# Patient Record
Sex: Female | Born: 1990 | Race: Black or African American | Hispanic: No | Marital: Single | Smoking: Former smoker
Health system: Southern US, Community
[De-identification: ages and names within clinical notes are randomized; demographics above are authoritative.]

## PROBLEM LIST (undated history)

## (undated) DIAGNOSIS — M1990 Unspecified osteoarthritis, unspecified site: Secondary | ICD-10-CM

## (undated) DIAGNOSIS — T7840XA Allergy, unspecified, initial encounter: Secondary | ICD-10-CM

## (undated) DIAGNOSIS — I10 Essential (primary) hypertension: Secondary | ICD-10-CM

## (undated) DIAGNOSIS — D649 Anemia, unspecified: Secondary | ICD-10-CM

## (undated) HISTORY — DX: Allergy, unspecified, initial encounter: T78.40XA

## (undated) HISTORY — PX: TONSILLECTOMY: SUR1361

## (undated) HISTORY — DX: Unspecified osteoarthritis, unspecified site: M19.90

## (undated) HISTORY — DX: Essential (primary) hypertension: I10

---

## 2010-05-11 ENCOUNTER — Emergency Department: Payer: Self-pay | Admitting: Emergency Medicine

## 2010-11-09 ENCOUNTER — Observation Stay: Payer: Self-pay | Admitting: Obstetrics and Gynecology

## 2010-11-10 ENCOUNTER — Inpatient Hospital Stay: Payer: Self-pay

## 2011-04-06 ENCOUNTER — Emergency Department: Payer: Self-pay | Admitting: Internal Medicine

## 2011-04-06 LAB — URINALYSIS, COMPLETE
Bilirubin,UR: NEGATIVE
Glucose,UR: NEGATIVE mg/dL (ref 0–75)
Ketone: NEGATIVE
Nitrite: NEGATIVE
Ph: 5 (ref 4.5–8.0)
Protein: 30
RBC,UR: 356 /HPF (ref 0–5)
Specific Gravity: 1.029 (ref 1.003–1.030)
Squamous Epithelial: 5
WBC UR: 34 /HPF (ref 0–5)

## 2011-04-06 LAB — CBC
HCT: 40.9 % (ref 35.0–47.0)
HGB: 13.5 g/dL (ref 12.0–16.0)
MCH: 25.6 pg — ABNORMAL LOW (ref 26.0–34.0)
MCHC: 33 g/dL (ref 32.0–36.0)
MCV: 78 fL — ABNORMAL LOW (ref 80–100)
Platelet: 247 10*3/uL (ref 150–440)
RBC: 5.27 10*6/uL — ABNORMAL HIGH (ref 3.80–5.20)
RDW: 15.7 % — ABNORMAL HIGH (ref 11.5–14.5)
WBC: 5 10*3/uL (ref 3.6–11.0)

## 2011-04-06 LAB — WET PREP, GENITAL

## 2011-04-06 LAB — PREGNANCY, URINE: Pregnancy Test, Urine: NEGATIVE m[IU]/mL

## 2012-06-08 ENCOUNTER — Emergency Department: Payer: Self-pay | Admitting: Emergency Medicine

## 2012-06-08 LAB — URINALYSIS, COMPLETE
Bilirubin,UR: NEGATIVE
Glucose,UR: NEGATIVE mg/dL (ref 0–75)
Ketone: NEGATIVE
Nitrite: NEGATIVE
Ph: 5 (ref 4.5–8.0)
Protein: 30
RBC,UR: 18 /HPF (ref 0–5)
Specific Gravity: 1.028 (ref 1.003–1.030)
Squamous Epithelial: 13
WBC UR: 50 /HPF (ref 0–5)

## 2012-08-21 ENCOUNTER — Emergency Department: Payer: Self-pay | Admitting: Internal Medicine

## 2012-08-21 LAB — URINALYSIS, COMPLETE
Bilirubin,UR: NEGATIVE
Blood: NEGATIVE
Glucose,UR: NEGATIVE mg/dL (ref 0–75)
Ketone: NEGATIVE
Nitrite: POSITIVE
Ph: 7 (ref 4.5–8.0)
Protein: NEGATIVE
RBC,UR: 4 /HPF (ref 0–5)
Specific Gravity: 1.026 (ref 1.003–1.030)
Squamous Epithelial: 7
WBC UR: 30 /HPF (ref 0–5)

## 2012-08-21 LAB — GC/CHLAMYDIA PROBE AMP

## 2012-08-21 LAB — WET PREP, GENITAL

## 2012-10-27 ENCOUNTER — Emergency Department: Payer: Self-pay | Admitting: Emergency Medicine

## 2013-03-17 ENCOUNTER — Emergency Department: Payer: Self-pay | Admitting: Emergency Medicine

## 2013-04-17 ENCOUNTER — Emergency Department: Payer: Self-pay | Admitting: Emergency Medicine

## 2013-10-12 ENCOUNTER — Emergency Department: Payer: Self-pay | Admitting: Emergency Medicine

## 2014-11-17 ENCOUNTER — Encounter: Payer: Self-pay | Admitting: Urgent Care

## 2014-11-17 DIAGNOSIS — K088 Other specified disorders of teeth and supporting structures: Secondary | ICD-10-CM | POA: Insufficient documentation

## 2014-11-17 DIAGNOSIS — Z720 Tobacco use: Secondary | ICD-10-CM | POA: Insufficient documentation

## 2014-11-17 NOTE — ED Notes (Signed)
Patient presents with c/o RIGHT upper jaw pain for "awhile". Reports that she cant eat, sleep, or talk well. No local dentist.

## 2014-11-18 ENCOUNTER — Emergency Department
Admission: EM | Admit: 2014-11-18 | Discharge: 2014-11-18 | Discharge status: Against Medical Advice | Payer: Self-pay | Attending: Emergency Medicine | Admitting: Emergency Medicine

## 2014-12-16 ENCOUNTER — Emergency Department
Admission: EM | Admit: 2014-12-16 | Discharge: 2014-12-16 | Disposition: A | Discharge status: Home / Self Care | Payer: Self-pay | Attending: Emergency Medicine | Admitting: Emergency Medicine

## 2014-12-16 ENCOUNTER — Encounter: Payer: Self-pay | Admitting: Emergency Medicine

## 2014-12-16 DIAGNOSIS — N390 Urinary tract infection, site not specified: Secondary | ICD-10-CM | POA: Insufficient documentation

## 2014-12-16 DIAGNOSIS — Z3202 Encounter for pregnancy test, result negative: Secondary | ICD-10-CM | POA: Insufficient documentation

## 2014-12-16 DIAGNOSIS — Z720 Tobacco use: Secondary | ICD-10-CM | POA: Insufficient documentation

## 2014-12-16 DIAGNOSIS — K5900 Constipation, unspecified: Secondary | ICD-10-CM | POA: Insufficient documentation

## 2014-12-16 DIAGNOSIS — N940 Mittelschmerz: Secondary | ICD-10-CM | POA: Insufficient documentation

## 2014-12-16 LAB — WET PREP, GENITAL
Clue Cells Wet Prep HPF POC: NONE SEEN
Trich, Wet Prep: NONE SEEN
WBC, Wet Prep HPF POC: NONE SEEN
Yeast Wet Prep HPF POC: NONE SEEN

## 2014-12-16 LAB — URINALYSIS COMPLETE WITH MICROSCOPIC (ARMC ONLY)
Bacteria, UA: NONE SEEN
Bilirubin Urine: NEGATIVE
Glucose, UA: NEGATIVE mg/dL
Ketones, ur: NEGATIVE mg/dL
Nitrite: NEGATIVE
Protein, ur: 30 mg/dL — AB
Specific Gravity, Urine: 1.029 (ref 1.005–1.030)
pH: 6 (ref 5.0–8.0)

## 2014-12-16 LAB — POCT PREGNANCY, URINE: Preg Test, Ur: NEGATIVE

## 2014-12-16 LAB — CHLAMYDIA/NGC RT PCR (ARMC ONLY)
Chlamydia Tr: NOT DETECTED
N gonorrhoeae: NOT DETECTED

## 2014-12-16 LAB — HCG, QUANTITATIVE, PREGNANCY: hCG, Beta Chain, Quant, S: <1 m[IU]/mL (ref <5)

## 2014-12-16 MED ORDER — CEPHALEXIN 500 MG PO CAPS
500.0000 mg | ORAL_CAPSULE | Freq: Once | ORAL | Status: AC
  Administered 2014-12-16: 500 mg via ORAL
  Filled 2014-12-16 (×2): qty 1

## 2014-12-16 MED ORDER — CEPHALEXIN 500 MG PO CAPS: 500.0000 mg | ORAL_CAPSULE | Freq: Two times a day (BID) | ORAL | Status: DC

## 2014-12-16 MED ORDER — IBUPROFEN 800 MG PO TABS
800.0000 mg | ORAL_TABLET | Freq: Once | ORAL | Status: AC
  Administered 2014-12-16: 800 mg via ORAL
  Filled 2014-12-16: qty 1

## 2014-12-16 NOTE — Discharge Instructions (Signed)
You have been seen in the Emergency Department (ED) for abdominal pain.  Your evaluation did not identify a clear cause of your symptoms but was generally reassuring. It may be caused by a mild urinary tract infection or pain in between your menstrual cycles (Mittelschmerz).  Please follow up as instructed above regarding todays emergent visit and the symptoms that are bothering you.  Return to the ED if your abdominal pain worsens or fails to improve, you develop bloody vomiting, bloody diarrhea, you are unable to tolerate fluids due to vomiting, fever greater than 101, or other symptoms that concern you.   Abdominal Pain, Women Abdominal (stomach, pelvic, or belly) pain can be caused by many things. It is important to tell your doctor:  The location of the pain.  Does it come and go or is it present all the time?  Are there things that start the pain (eating certain foods, exercise)?  Are there other symptoms associated with the pain (fever, nausea, vomiting, diarrhea)? All of this is helpful to know when trying to find the cause of the pain. CAUSES   Stomach: virus or bacteria infection, or ulcer.  Intestine: appendicitis (inflamed appendix), regional ileitis (Crohn's disease), ulcerative colitis (inflamed colon), irritable bowel syndrome, diverticulitis (inflamed diverticulum of the colon), or cancer of the stomach or intestine.  Gallbladder disease or stones in the gallbladder.  Kidney disease, kidney stones, or infection.  Pancreas infection or cancer.  Fibromyalgia (pain disorder).  Diseases of the female organs:  Uterus: fibroid (non-cancerous) tumors or infection.  Fallopian tubes: infection or tubal pregnancy.  Ovary: cysts or tumors.  Pelvic adhesions (scar tissue).  Endometriosis (uterus lining tissue growing in the pelvis and on the pelvic organs).  Pelvic congestion syndrome (female organs filling up with blood just before the menstrual period).  Pain with  the menstrual period.  Pain with ovulation (producing an egg).  Pain with an IUD (intrauterine device, birth control) in the uterus.  Cancer of the female organs.  Functional pain (pain not caused by a disease, may improve without treatment).  Psychological pain.  Depression. DIAGNOSIS  Your doctor will decide the seriousness of your pain by doing an examination.  Blood tests.  X-rays.  Ultrasound.  CT scan (computed tomography, special type of X-ray).  MRI (magnetic resonance imaging).  Cultures, for infection.  Barium enema (dye inserted in the large intestine, to better view it with X-rays).  Colonoscopy (looking in intestine with a lighted tube).  Laparoscopy (minor surgery, looking in abdomen with a lighted tube).  Major abdominal exploratory surgery (looking in abdomen with a large incision). TREATMENT  The treatment will depend on the cause of the pain.   Many cases can be observed and treated at home.  Over-the-counter medicines recommended by your caregiver.  Prescription medicine.  Antibiotics, for infection.  Birth control pills, for painful periods or for ovulation pain.  Hormone treatment, for endometriosis.  Nerve blocking injections.  Physical therapy.  Antidepressants.  Counseling with a psychologist or psychiatrist.  Minor or major surgery. HOME CARE INSTRUCTIONS   Do not take laxatives, unless directed by your caregiver.  Take over-the-counter pain medicine only if ordered by your caregiver. Do not take aspirin because it can cause an upset stomach or bleeding.  Try a clear liquid diet (broth or water) as ordered by your caregiver. Slowly move to a bland diet, as tolerated, if the pain is related to the stomach or intestine.  Have a thermometer and take your temperature several times  a day, and record it.  Bed rest and sleep, if it helps the pain.  Avoid sexual intercourse, if it causes pain.  Avoid stressful  situations.  Keep your follow-up appointments and tests, as your caregiver orders.  If the pain does not go away with medicine or surgery, you may try:  Acupuncture.  Relaxation exercises (yoga, meditation).  Group therapy.  Counseling. SEEK MEDICAL CARE IF:   You notice certain foods cause stomach pain.  Your home care treatment is not helping your pain.  You need stronger pain medicine.  You want your IUD removed.  You feel faint or lightheaded.  You develop nausea and vomiting.  You develop a rash.  You are having side effects or an allergy to your medicine. SEEK IMMEDIATE MEDICAL CARE IF:   Your pain does not go away or gets worse.  You have a fever.  Your pain is felt only in portions of the abdomen. The right side could possibly be appendicitis. The left lower portion of the abdomen could be colitis or diverticulitis.  You are passing blood in your stools (bright red or black tarry stools, with or without vomiting).  You have blood in your urine.  You develop chills, with or without a fever.  You pass out. MAKE SURE YOU:   Understand these instructions.  Will watch your condition.  Will get help right away if you are not doing well or get worse. Document Released: 01/15/2007 Document Revised: 08/04/2013 Document Reviewed: 02/04/2009 [facility] Patient Information 2015 [place], [place]. This information is not intended to replace advice given to you by your health care provider. Make sure you discuss any questions you have with your health care provider.  Urinary Tract Infection Urinary tract infections (UTIs) can develop anywhere along your urinary tract. Your urinary tract is your body's drainage system for removing wastes and extra water. Your urinary tract includes two kidneys, two ureters, a bladder, and a urethra. Your kidneys are a pair of bean-shaped organs. Each kidney is about the size of your fist. They are located below your ribs, one on each side  of your spine. CAUSES Infections are caused by microbes, which are microscopic organisms, including fungi, viruses, and bacteria. These organisms are so small that they can only be seen through a microscope. Bacteria are the microbes that most commonly cause UTIs. SYMPTOMS  Symptoms of UTIs may vary by age and gender of the patient and by the location of the infection. Symptoms in young women typically include a frequent and intense urge to urinate and a painful, burning feeling in the bladder or urethra during urination. Older women and men are more likely to be tired, shaky, and weak and have muscle aches and abdominal pain. A fever may mean the infection is in your kidneys. Other symptoms of a kidney infection include pain in your back or sides below the ribs, nausea, and vomiting. DIAGNOSIS To diagnose a UTI, your caregiver will ask you about your symptoms. Your caregiver also will ask to provide a urine sample. The urine sample will be tested for bacteria and white blood cells. White blood cells are made by your body to help fight infection. TREATMENT  Typically, UTIs can be treated with medication. Because most UTIs are caused by a bacterial infection, they usually can be treated with the use of antibiotics. The choice of antibiotic and length of treatment depend on your symptoms and the type of bacteria causing your infection. HOME CARE INSTRUCTIONS  If you were prescribed  antibiotics, take them exactly as your caregiver instructs you. Finish the medication even if you feel better after you have only taken some of the medication.  Drink enough water and fluids to keep your urine clear or pale yellow.  Avoid caffeine, tea, and carbonated beverages. They tend to irritate your bladder.  Empty your bladder often. Avoid holding urine for long periods of time.  Empty your bladder before and after sexual intercourse.  After a bowel movement, women should cleanse from front to back. Use each  tissue only once. SEEK MEDICAL CARE IF:   You have back pain.  You develop a fever.  Your symptoms do not begin to resolve within 3 days. SEEK IMMEDIATE MEDICAL CARE IF:   You have severe back pain or lower abdominal pain.  You develop chills.  You have nausea or vomiting.  You have continued burning or discomfort with urination. MAKE SURE YOU:   Understand these instructions.  Will watch your condition.  Will get help right away if you are not doing well or get worse. Document Released: 12/28/2004 Document Revised: 09/19/2011 Document Reviewed: 04/28/2011 [facility] Patient Information 2015 [place], [place]. This information is not intended to replace advice given to you by your health care provider. Make sure you discuss any questions you have with your health care provider.

## 2014-12-16 NOTE — ED Provider Notes (Signed)
[facility] Emergency Department Provider Note  ____________________________________________  Time seen: Approximately 5:56 PM  I have reviewed the triage vital signs and the nursing notes.   HISTORY  Chief Complaint Vaginal Bleeding; Headache; and Nausea    HPI Laurie Petersen is a 24 y.o. female G1 P1 who had her last menstrual period about 10 days ago who presents with pelvic cramping, nausea, general malaise, and some vaginal bleeding since yesterday.  She reports that she noticed the bleeding twice when she wiped after using the bathroom.  She has also had some mild constipation.  She denies fever/chills, chest pain, shortness of breath.  She describes the cramping is mild to severe, intermittent, and waxing and waning.  Nothing specifically makes it better or worse.  He is sexually active with her boyfriend and they do not use condoms.  She denies any recent vaginal discharge.she is not having any dysuria and does not believe the blood is coming from her urine.   History reviewed. No pertinent past medical history.  There are no active problems to display for this patient.   History reviewed. No pertinent past surgical history.  Current Outpatient Rx  Name  Route  Sig  Dispense  Refill  . cephALEXin (KEFLEX) 500 MG capsule   Oral   Take 1 capsule (500 mg total) by mouth 2 (two) times daily.   14 capsule   0     Allergies Review of patient's allergies indicates no known allergies.  History reviewed. No pertinent family history.  Social History Social History  Substance Use Topics  . Smoking status: Current Every Day Smoker  . Smokeless tobacco: None  . Alcohol Use: No    Review of Systems Constitutional: No fever/chills Eyes: No visual changes. ENT: No sore throat. Cardiovascular: Denies chest pain. Respiratory: Denies shortness of breath. Gastrointestinal: intermittent pelvic cramping.  Nausea with no vomiting.  Frequent  constipation. Genitourinary: Negative for dysuria. Musculoskeletal: Negative for back pain. Skin: Negative for rash. Neurological: Negative for headaches, focal weakness or numbness.  10-point ROS otherwise negative.  ____________________________________________   PHYSICAL EXAM:  VITAL SIGNS: ED Triage Vitals  Enc Vitals Group     BP 12/16/14 1519 129/86 mmHg     Pulse Rate 12/16/14 1519 81     Resp 12/16/14 1519 18     Temp 12/16/14 1519 97.3 F (36.3 C)     Temp Source 12/16/14 1519 Oral     SpO2 12/16/14 1519 100 %     Weight 12/16/14 1519 145 lb (65.772 kg)     Height 12/16/14 1519  (1.676 m)     Head Cir --      Peak Flow --      Pain Score 12/16/14 1520 10     Pain Loc --      Pain Edu? --      Excl. in GC? --     Constitutional: Alert and oriented. Well appearing and in no acute distress. Eyes: Conjunctivae are normal. PERRL. EOMI. Head: Atraumatic. Nose: No congestion/rhinnorhea. Mouth/Throat: Mucous membranes are moist.  Oropharynx non-erythematous. Neck: No stridor.   Cardiovascular: Normal rate, regular rhythm. Grossly normal heart sounds.  Good peripheral circulation. Respiratory: Normal respiratory effort.  No retractions. Lungs CTAB. Gastrointestinal: Soft. Mild generalized tenderness to palpation with no focal tenderness and no rebound or guarding but it seems to be most notable in the suprapubic region. No distention.  Genitourinary: Normal external exam.  Moderate to large amount of whitish discharge present  in the vaginal vault.  Cervix is well-appearing with no cervicitis or friable lesions.  No cervical motion tenderness on bimanual exam and no adnexal tenderness. Musculoskeletal: No lower extremity tenderness nor edema.  No joint effusions. Neurologic:  Normal speech and language. No gross focal neurologic deficits are appreciated.  Skin:  Skin is warm, dry and intact. No rash noted. Psychiatric: Mood and affect are normal. Speech and behavior  are normal.  ____________________________________________   LABS (all labs ordered are listed, but only abnormal results are displayed)  Labs Reviewed  URINALYSIS COMPLETEWITH MICROSCOPIC ([facility] ONLY) - Abnormal; Notable for the following:    Color, Urine YELLOW (*)    APPearance HAZY (*)    Hgb urine dipstick 3+ (*)    Protein, ur 30 (*)    Leukocytes, UA 2+ (*)    Squamous Epithelial / LPF 6-30 (*)    All other components within normal limits  CHLAMYDIA/NGC RT PCR ([facility] ONLY)  WET PREP, GENITAL  HCG, QUANTITATIVE, PREGNANCY  POC URINE PREG, ED  POCT PREGNANCY, URINE   ____________________________________________  EKG  Not indicated ____________________________________________  RADIOLOGY   No results found.  ____________________________________________   PROCEDURES  Procedure(s) performed: None  Critical Care performed: No ____________________________________________   INITIAL IMPRESSION / ASSESSMENT AND PLAN / ED COURSE  Pertinent labs & imaging results that were available during my care of the patient were reviewed by me and considered in my medical decision making (see chart for details).  The patient is well-appearing and in no acute distress with normal vital signs.  An extensive abdominal plain/pelvic pain workup has been unremarkable except for what appears to be a mild urinary tract infection.  I do not believe that her symptoms represent a kidney stone as she has no CVA tenderness nor flank pain and no history of stones, and her tenderness is most notable in the suprapubic region which is consistent with a UTI; if this is nephrolithiasis it is a very atypical presentation but at this time I do not believe she would benefit from a CT scan or additional imaging. Her pelvic exam was unremarkable.  I will treat her for a urinary tract infection and I gave her my usual and customary return precautions.  She understands and agrees with the  plan.  ____________________________________________  FINAL CLINICAL IMPRESSION(S) / ED DIAGNOSES  Final diagnoses:  UTI (urinary tract infection), uncomplicated  Mittelschmerz      NEW MEDICATIONS STARTED DURING THIS VISIT:  New Prescriptions   CEPHALEXIN (KEFLEX) 500 MG CAPSULE    Take 1 capsule (500 mg total) by mouth 2 (two) times daily.     Loleta Rose, MD 12/16/14 2037

## 2014-12-16 NOTE — ED Notes (Signed)
Patient to ED with report of pelvic pain that started yesterday along with bright red bleeding, reports that she had her period the 1-4 of September. Patient also reports feeling headaches, nausea and just generally not well over the last few weeks.

## 2015-02-11 ENCOUNTER — Encounter: Payer: Self-pay | Admitting: *Deleted

## 2015-02-11 ENCOUNTER — Emergency Department
Admission: EM | Admit: 2015-02-11 | Discharge: 2015-02-11 | Disposition: A | Discharge status: Home / Self Care | Payer: Self-pay | Attending: Student | Admitting: Student

## 2015-02-11 DIAGNOSIS — Z3202 Encounter for pregnancy test, result negative: Secondary | ICD-10-CM | POA: Insufficient documentation

## 2015-02-11 DIAGNOSIS — Z720 Tobacco use: Secondary | ICD-10-CM | POA: Insufficient documentation

## 2015-02-11 DIAGNOSIS — Z792 Long term (current) use of antibiotics: Secondary | ICD-10-CM | POA: Insufficient documentation

## 2015-02-11 DIAGNOSIS — L02212 Cutaneous abscess of back [any part, except buttock]: Secondary | ICD-10-CM | POA: Insufficient documentation

## 2015-02-11 LAB — POCT PREGNANCY, URINE: Preg Test, Ur: NEGATIVE

## 2015-02-11 MED ORDER — SULFAMETHOXAZOLE-TRIMETHOPRIM 800-160 MG PO TABS: 1.0000 | ORAL_TABLET | Freq: Two times a day (BID) | ORAL | Status: DC

## 2015-02-11 MED ORDER — HYDROCODONE-ACETAMINOPHEN 5-325 MG PO TABS
1.0000 | ORAL_TABLET | Freq: Once | ORAL | Status: AC
  Administered 2015-02-11: 1 via ORAL
  Filled 2015-02-11: qty 1

## 2015-02-11 MED ORDER — HYDROCODONE-ACETAMINOPHEN 5-325 MG PO TABS: 1.0000 | ORAL_TABLET | ORAL | Status: DC | PRN

## 2015-02-11 NOTE — ED Notes (Signed)
Pt has large raised area to lower back with a white area in the center

## 2015-02-11 NOTE — ED Provider Notes (Signed)
[facility] Emergency Department Provider Note  ____________________________________________  Time seen: Approximately 9:15 AM  I have reviewed the triage vital signs and the nursing notes.   HISTORY  Chief Complaint Abscess   HPI Laurie Petersen is a 24 y.o. female is here complaining of abscess to her lower back. She states she has a white center to it and she was hoping it would pop on its on. She states she has never had anything like this before in the past.She denies any fever or chills. There's been no nausea or vomiting. Patient has not taken any over-the-counter medication for her pain. She says she is unable to lie on her back due to pain in this area. Currently she rates her pain a 10 out of 10.   History reviewed. No pertinent past medical history.  There are no active problems to display for this patient.   History reviewed. No pertinent past surgical history.  Current Outpatient Rx  Name  Route  Sig  Dispense  Refill  . cephALEXin (KEFLEX) 500 MG capsule   Oral   Take 1 capsule (500 mg total) by mouth 2 (two) times daily.   14 capsule   0   . HYDROcodone-acetaminophen (NORCO/VICODIN) 5-325 MG tablet   Oral   Take 1 tablet by mouth every 4 (four) hours as needed for moderate pain.   20 tablet   0   . sulfamethoxazole-trimethoprim (BACTRIM DS,SEPTRA DS) 800-160 MG tablet   Oral   Take 1 tablet by mouth 2 (two) times daily.   20 tablet   0     Allergies Review of patient's allergies indicates no known allergies.  No family history on file.  Social History Social History  Substance Use Topics  . Smoking status: Current Every Day Smoker -- 0.50 packs/day    Types: Cigarettes  . Smokeless tobacco: None  . Alcohol Use: No    Review of Systems Constitutional: No fever/chills Cardiovascular: Denies chest pain. Respiratory: Denies shortness of breath. Gastrointestinal:   No nausea, no vomiting. Musculoskeletal: Negative for  back pain. Skin: Negative for rash. Neurological: Negative for headaches, focal weakness or numbness.  10-point ROS otherwise negative.  ____________________________________________   PHYSICAL EXAM:  VITAL SIGNS: ED Triage Vitals  Enc Vitals Group     BP 02/11/15 0902 128/74 mmHg     Pulse Rate 02/11/15 0902 92     Resp 02/11/15 0902 18     Temp 02/11/15 0902 98.5 F (36.9 C)     Temp Source 02/11/15 0902 Oral     SpO2 02/11/15 0902 98 %     Weight 02/11/15 0902 140 lb (63.504 kg)     Height 02/11/15 0902 5\' 6"  (1.676 m)     Head Cir --      Peak Flow --      Pain Score 02/11/15 0902 10     Pain Loc --      Pain Edu? --      Excl. in GC? --     Constitutional: Alert and oriented. Well appearing and in no acute distress. Eyes: Conjunctivae are normal. PERRL. EOMI. Head: Atraumatic. Nose: No congestion/rhinnorhea. Neck: No stridor.   Cardiovascular: Normal rate, regular rhythm. Grossly normal heart sounds.  Good peripheral circulation. Respiratory: Normal respiratory effort.  No retractions. Lungs CTAB. Gastrointestinal: Soft and nontender. No distention.  Musculoskeletal: No lower extremity tenderness nor edema.  No joint effusions. Neurologic:  Normal speech and language. No gross focal neurologic deficits are appreciated.  No gait instability. Skin:  Skin is warm, dry and intact. There is one single lesion on the lower back with erythema. Area is hard, nonfluctuant and extremely tender. Psychiatric: Mood and affect are normal. Speech and behavior are normal.  ____________________________________________   LABS (all labs ordered are listed, but only abnormal results are displayed)  Labs Reviewed  POC URINE PREG, ED  POCT PREGNANCY, URINE    PROCEDURES  Procedure(s) performed: None  Critical Care performed: No  ____________________________________________   INITIAL IMPRESSION / ASSESSMENT AND PLAN / ED COURSE  Pertinent labs & imaging results that were  available during my care of the patient were reviewed by me and considered in my medical decision making (see chart for details).  Pregnancy test was negative. Patient was given Norco as needed for pain and Bactrim DS for 10 days. Patient was instructed to use warm compresses to the area frequently. She is return to the emergency room if any continued problems and if the area does not open up on its on. She is aware that she may have to still have it drained. ____________________________________________   FINAL CLINICAL IMPRESSION(S) / ED DIAGNOSES  Final diagnoses:  Abscess of lower back      Tommi Rumps, PA-C 02/11/15 1221  Gayla Doss, MD 02/11/15 1626

## 2015-02-11 NOTE — Discharge Instructions (Signed)
Abscess An abscess (boil or furuncle) is an infected area on or under the skin. This area is filled with yellowish-white fluid (pus) and other material (debris). HOME CARE   Only take medicines as told by your doctor.  If you were given antibiotic medicine, take it as directed. Finish the medicine even if you start to feel better.  If gauze is used, follow your doctor's directions for changing the gauze.  To avoid spreading the infection:  Keep your abscess covered with a bandage.  Wash your hands well.  Do not share personal care items, towels, or whirlpools with others.  Avoid skin contact with others.  Keep your skin and clothes clean around the abscess.  Keep all doctor visits as told. GET HELP RIGHT AWAY IF:   You have more pain, puffiness (swelling), or redness in the wound site.  You have more fluid or blood coming from the wound site.  You have muscle aches, chills, or you feel sick.  You have a fever. MAKE SURE YOU:   Understand these instructions.  Will watch your condition.  Will get help right away if you are not doing well or get worse.   This information is not intended to replace advice given to you by your health care provider. Make sure you discuss any questions you have with your health care provider.   Document Released: 09/06/2007 Document Revised: 09/19/2011 Document Reviewed: 06/03/2011 [employer] Interactive Patient Education 2016 [employer]    APPLY MOIST WARM HEAT TO AREA FREQUENTLY NEXT 2 DAYS TAKE MEDICATION AS DIRECTED   SEPTRA DS TWICE A DAY UNTIL FINISHED NORCO AS NEEDED FOR PAIN RETURN TO ER IF ANY AREA BECOMES SOFT FOR INCISION

## 2015-02-13 ENCOUNTER — Encounter: Payer: Self-pay | Admitting: Emergency Medicine

## 2015-02-13 ENCOUNTER — Emergency Department
Admission: EM | Admit: 2015-02-13 | Discharge: 2015-02-14 | Disposition: A | Discharge status: Home / Self Care | Payer: Self-pay | Attending: Emergency Medicine | Admitting: Emergency Medicine

## 2015-02-13 DIAGNOSIS — Z792 Long term (current) use of antibiotics: Secondary | ICD-10-CM | POA: Insufficient documentation

## 2015-02-13 DIAGNOSIS — Z79899 Other long term (current) drug therapy: Secondary | ICD-10-CM | POA: Insufficient documentation

## 2015-02-13 DIAGNOSIS — L02212 Cutaneous abscess of back [any part, except buttock]: Secondary | ICD-10-CM | POA: Insufficient documentation

## 2015-02-13 DIAGNOSIS — Z720 Tobacco use: Secondary | ICD-10-CM | POA: Insufficient documentation

## 2015-02-13 MED ORDER — LIDOCAINE HCL (PF) 1 % IJ SOLN
INTRAMUSCULAR | Status: AC
  Filled 2015-02-13: qty 5

## 2015-02-13 NOTE — ED Notes (Signed)
Patient with abscess to midline lower back. Patient was seen here 2 days ago and started on antibiotics. Patient reports that the abscess is worse. Denies fever.

## 2015-02-14 MED ORDER — OXYCODONE-ACETAMINOPHEN 5-325 MG PO TABS: 1.0000 | ORAL_TABLET | Freq: Four times a day (QID) | ORAL | Status: DC | PRN

## 2015-02-14 MED ORDER — OXYCODONE-ACETAMINOPHEN 5-325 MG PO TABS
1.0000 | ORAL_TABLET | Freq: Once | ORAL | Status: AC
  Administered 2015-02-14: 1 via ORAL
  Filled 2015-02-14: qty 1

## 2015-02-14 NOTE — ED Provider Notes (Signed)
[facility] Emergency Department Provider Note  ____________________________________________  Time seen: 12:15 AM  I have reviewed the triage vital signs and the nursing notes.   HISTORY  Chief Complaint Abscess    HPI Laurie Petersen is a 24 y.o. female who complains of pain and swelling of the lower back in the middle. This is been going on for about 5 days gradually worsening. She was seen in the emergency room 2 days ago diagnosed with a cellulitis or early abscess in the area and started on Bactrim. At that time there was no appreciable fluid collection that would benefit from incision and drainage. Patient denies any fever vomiting or other concerns but states that despite taking the Bactrim antibiotic as prescribed, she is having worsening swelling and pain in the area. No numbness tingling weakness or shooting pains down the legs. No bowel or bladder incontinence. No pain with movement of the spine.     History reviewed. No pertinent past medical history.   There are no active problems to display for this patient.    Past Surgical History  Procedure Laterality Date  . Tonsillectomy       Current Outpatient Rx  Name  Route  Sig  Dispense  Refill  . HYDROcodone-acetaminophen (NORCO/VICODIN) 5-325 MG tablet   Oral   Take 1 tablet by mouth every 4 (four) hours as needed for moderate pain.   20 tablet   0   . sulfamethoxazole-trimethoprim (BACTRIM DS,SEPTRA DS) 800-160 MG tablet   Oral   Take 1 tablet by mouth 2 (two) times daily.   20 tablet   0   . cephALEXin (KEFLEX) 500 MG capsule   Oral   Take 1 capsule (500 mg total) by mouth 2 (two) times daily. Patient not taking: Reported on 02/13/2015   14 capsule   0   . oxyCODONE-acetaminophen (ROXICET) 5-325 MG tablet   Oral   Take 1 tablet by mouth every 6 (six) hours as needed for severe pain.   12 tablet   0      Allergies Review of patient's allergies indicates no known  allergies.   No family history on file.  Social History Social History  Substance Use Topics  . Smoking status: Current Every Day Smoker -- 0.50 packs/day    Types: Cigarettes  . Smokeless tobacco: None  . Alcohol Use: No    Review of Systems  Constitutional:   No fever or chills. No weight changes Eyes:   No blurry vision or double vision.  ENT:   No sore throat. Cardiovascular:   No chest pain. Respiratory:   No dyspnea or cough. Gastrointestinal:   Negative for abdominal pain, vomiting and diarrhea.  No BRBPR or melena. Genitourinary:   Negative for dysuria, urinary retention, bloody urine, or difficulty urinating. Musculoskeletal:   Low back pain as above. Skin:   Negative for rash. Positive swelling in the lower back. Neurological:   Negative for headaches, focal weakness or numbness. Psychiatric:  No anxiety or depression.   Endocrine:  No hot/cold intolerance, changes in energy, or sleep difficulty.  10-point ROS otherwise negative.  ____________________________________________   PHYSICAL EXAM:  VITAL SIGNS: ED Triage Vitals  Enc Vitals Group     BP 02/13/15 2221 128/54 mmHg     Pulse Rate 02/13/15 2221 84     Resp 02/13/15 2221 17     Temp 02/13/15 2221 98 F (36.7 C)     Temp Source 02/13/15 2221 Axillary  SpO2 02/13/15 2221 100 %     Weight 02/13/15 2221 140 lb (63.504 kg)     Height 02/13/15 2221  (1.676 m)     Head Cir --      Peak Flow --      Pain Score 02/13/15 2227 10     Pain Loc --      Pain Edu? --      Excl. in GC? --      Constitutional:   Alert and oriented. Well appearing and in no distress. Eyes:   No scleral icterus. No conjunctival pallor. PERRL. EOMI ENT   Head:   Normocephalic and atraumatic.   Nose:   No congestion/rhinnorhea. No septal hematoma   Mouth/Throat:   MMM, no pharyngeal erythema. No peritonsillar mass. No uvula shift.   Neck:   No stridor. No SubQ emphysema. No  meningismus. Hematological/Lymphatic/Immunilogical:   No cervical lymphadenopathy. Cardiovascular:   RRR. Normal and symmetric distal pulses are present in all extremities. No murmurs, rubs, or gallops. Respiratory:   Normal respiratory effort without tachypnea nor retractions. Breath sounds are clear and equal bilaterally. No wheezes/rales/rhonchi. Gastrointestinal:   Soft and nontender. No distention. There is no CVA tenderness.  No rebound, rigidity, or guarding. Genitourinary:   deferred Musculoskeletal:   Nontender with normal range of motion in all extremities. No joint effusions.  No lower extremity tenderness.  No edema. No midline spinal tenderness over the bones. In the lumbar back at the midline there is a large area of induration and tenderness with warmth. There is a 1 cm head overlying the area of L3 with some fluctuance. Using bedside ultrasound, I was able to visualize a subcutaneous fluid collection with cobblestoning appearance of the soft tissues consistent with abscess. Neurologic:   Normal speech and language.  CN 2-10 normal. Motor grossly intact. No pronator drift.  Normal gait. No gross focal neurologic deficits are appreciated.  Skin:    Skin is warm, dry and intact. No rash noted.  No petechiae, purpura, or bullae. Psychiatric:   Mood and affect are normal. Speech and behavior are normal. Patient exhibits appropriate insight and judgment.  ____________________________________________    LABS (pertinent positives/negatives) (all labs ordered are listed, but only abnormal results are displayed) Labs Reviewed - No data to display ____________________________________________   EKG    ____________________________________________    RADIOLOGY    ____________________________________________   PROCEDURES INCISION AND DRAINAGE Performed by: Sharman Cheek Consent: Verbal consent obtained. Risks and benefits: risks, benefits and alternatives were  discussed Type: abscess  Body area: Lumbar spine  Anesthesia: local infiltration  Incision was made with a scalpel.  Local anesthetic: lidocaine 1% without epinephrine  Anesthetic total: 2 ml  Complexity: complex Blunt dissection to break up loculations  Drainage: purulent  Drainage amount: 5  Packing material: 1/4 in iodoform gauze  Patient tolerance: Patient tolerated the procedure well with no immediate complications.  EBL 5 mL   ____________________________________________   INITIAL IMPRESSION / ASSESSMENT AND PLAN / ED COURSE  Pertinent labs & imaging results that were available during my care of the patient were reviewed by me and considered in my medical decision making (see chart for details).  Patient presents for worsening of the cellulitis previously evaluated despite taking Bactrim. Incision and drainage performed today with removal of purulent material and packing. Continue Bactrim. Percocet now and new prescription for continued pain management. Medically stable for discharge home. No evidence of involvement of the spine or deeper soft tissue infection.  ____________________________________________   FINAL CLINICAL IMPRESSION(S) / ED DIAGNOSES  Final diagnoses:  Abscess of lower back      Sharman CheekPhillip Zaheer Wageman, MD 02/14/15 0030

## 2015-02-14 NOTE — Discharge Instructions (Signed)
You were prescribed a medication that is potentially sedating. Do not drink alcohol, drive or participate in any other potentially dangerous activities while taking this medication as it may make you sleepy. Do not take this medication with any other sedating medications, either prescription or over-the-counter. If you were prescribed Percocet or Vicodin, do not take these with acetaminophen (Tylenol) as it is already contained within these medications.   Opioid pain medications (or "narcotics") can be habit forming.  Use it as little as possible to achieve adequate pain control.  Do not use or use it with extreme caution if you have a history of opiate abuse or dependence.  If you are on a pain contract with your primary care doctor or a pain specialist, be sure to let them know you were prescribed this medication today from the [facility].  This medication is intended for your use only - do not give any to anyone else and keep it in a secure place where nobody else, especially children and pets, have access to it.  It will also cause or worsen constipation, so you may want to consider taking an over-the-counter stool softener while you are taking this medication.  Abscess An abscess is an infected area that contains a collection of pus and debris.It can occur in almost any part of the body. An abscess is also known as a furuncle or boil. CAUSES  An abscess occurs when tissue gets infected. This can occur from blockage of oil or sweat glands, infection of hair follicles, or a minor injury to the skin. As the body tries to fight the infection, pus collects in the area and creates pressure under the skin. This pressure causes pain. People with weakened immune systems have difficulty fighting infections and get certain abscesses more often.  SYMPTOMS Usually an abscess develops on the skin and becomes a painful mass that is red, warm, and tender. If the abscess forms under the  skin, you may feel a moveable soft area under the skin. Some abscesses break open (rupture) on their own, but most will continue to get worse without care. The infection can spread deeper into the body and eventually into the bloodstream, causing you to feel ill.  DIAGNOSIS  Your caregiver will take your medical history and perform a physical exam. A sample of fluid may also be taken from the abscess to determine what is causing your infection. TREATMENT  Your caregiver may prescribe antibiotic medicines to fight the infection. However, taking antibiotics alone usually does not cure an abscess. Your caregiver may need to make a small cut (incision) in the abscess to drain the pus. In some cases, gauze is packed into the abscess to reduce pain and to continue draining the area. HOME CARE INSTRUCTIONS   Only take over-the-counter or prescription medicines for pain, discomfort, or fever as directed by your caregiver.  If you were prescribed antibiotics, take them as directed. Finish them even if you start to feel better.  If gauze is used, follow your caregiver's directions for changing the gauze.  To avoid spreading the infection:  Keep your draining abscess covered with a bandage.  Wash your hands well.  Do not share personal care items, towels, or whirlpools with others.  Avoid skin contact with others.  Keep your skin and clothes clean around the abscess.  Keep all follow-up appointments as directed by your caregiver. SEEK MEDICAL CARE IF:   You have increased pain, swelling, redness, fluid drainage, or bleeding.  You have muscle aches, chills, or a general ill feeling. °· You have a fever. °MAKE SURE YOU:  °· Understand these instructions. °· Will watch your condition. °· Will get help right away if you are not doing well or get worse. °  °This information is not intended to replace advice given to you by your health care provider. Make sure you discuss any questions you have with  your health care provider. °  °Document Released: 12/28/2004 Document Revised: 09/19/2011 Document Reviewed: 06/02/2011 °Elsevier Interactive Patient Education ©2016 [employer] ° °Incision and Drainage °Incision and drainage is a procedure in which a sac-like structure (cystic structure) is opened and drained. The area to be drained usually contains material such as pus, fluid, or blood.  °LET YOUR CAREGIVER KNOW ABOUT:  °· Allergies to medicine. °· Medicines taken, including vitamins, herbs, eyedrops, over-the-counter medicines, and creams. °· Use of steroids (by mouth or creams). °· Previous problems with anesthetics or numbing medicines. °· History of bleeding problems or blood clots. °· Previous surgery. °· Other health problems, including diabetes and kidney problems. °· Possibility of pregnancy, if this applies. °RISKS AND COMPLICATIONS °· Pain. °· Bleeding. °· Scarring. °· Infection. °BEFORE THE PROCEDURE  °You may need to have an ultrasound or other imaging tests to see how large or deep your cystic structure is. Blood tests may also be used to determine if you have an infection or how severe the infection is. You may need to have a tetanus shot. °PROCEDURE  °The affected area is cleaned with a cleaning fluid. The cyst area will then be numbed with a medicine (local anesthetic). A small incision will be made in the cystic structure. A syringe or catheter may be used to drain the contents of the cystic structure, or the contents may be squeezed out. The area will then be flushed with a cleansing solution. After cleansing the area, it is often gently packed with a gauze or another wound dressing. Once it is packed, it will be covered with gauze and tape or some other type of wound dressing.  °AFTER THE PROCEDURE  °· Often, you will be allowed to go home right after the procedure. °· You may be given antibiotic medicine to prevent or heal an infection. °· If the area was packed with gauze or some other wound  dressing, you will likely need to come back in 1 to 2 days to get it removed. °· The area should heal in about 14 days. °  °This information is not intended to replace advice given to you by your health care provider. Make sure you discuss any questions you have with your health care provider. °  °Document Released: 09/13/2000 Document Revised: 09/19/2011 Document Reviewed: 05/15/2011 °Elsevier Interactive Patient Education ©2016 Elsevier Inc. ° °

## 2015-02-25 ENCOUNTER — Encounter (HOSPITAL_COMMUNITY): Payer: Self-pay | Admitting: Emergency Medicine

## 2015-02-25 ENCOUNTER — Emergency Department (HOSPITAL_COMMUNITY)
Admission: EM | Admit: 2015-02-25 | Discharge: 2015-02-25 | Disposition: A | Discharge status: Home / Self Care | Payer: No Typology Code available for payment source | Attending: Emergency Medicine | Admitting: Emergency Medicine

## 2015-02-25 ENCOUNTER — Emergency Department (HOSPITAL_COMMUNITY): Payer: No Typology Code available for payment source

## 2015-02-25 DIAGNOSIS — S20211A Contusion of right front wall of thorax, initial encounter: Secondary | ICD-10-CM | POA: Diagnosis not present

## 2015-02-25 DIAGNOSIS — Z3202 Encounter for pregnancy test, result negative: Secondary | ICD-10-CM | POA: Insufficient documentation

## 2015-02-25 DIAGNOSIS — F17210 Nicotine dependence, cigarettes, uncomplicated: Secondary | ICD-10-CM | POA: Insufficient documentation

## 2015-02-25 DIAGNOSIS — Y9389 Activity, other specified: Secondary | ICD-10-CM | POA: Insufficient documentation

## 2015-02-25 DIAGNOSIS — R52 Pain, unspecified: Secondary | ICD-10-CM

## 2015-02-25 DIAGNOSIS — Y998 Other external cause status: Secondary | ICD-10-CM | POA: Diagnosis not present

## 2015-02-25 DIAGNOSIS — S2231XA Fracture of one rib, right side, initial encounter for closed fracture: Secondary | ICD-10-CM | POA: Diagnosis not present

## 2015-02-25 DIAGNOSIS — Y92410 Unspecified street and highway as the place of occurrence of the external cause: Secondary | ICD-10-CM | POA: Diagnosis not present

## 2015-02-25 DIAGNOSIS — S301XXA Contusion of abdominal wall, initial encounter: Secondary | ICD-10-CM | POA: Diagnosis not present

## 2015-02-25 DIAGNOSIS — S29001A Unspecified injury of muscle and tendon of front wall of thorax, initial encounter: Secondary | ICD-10-CM | POA: Diagnosis present

## 2015-02-25 LAB — CBC WITH DIFFERENTIAL/PLATELET
Basophils Absolute: 0 10*3/uL (ref 0.0–0.1)
Basophils Relative: 0 %
Eosinophils Absolute: 0 10*3/uL (ref 0.0–0.7)
Eosinophils Relative: 0 %
HCT: 32.1 % — ABNORMAL LOW (ref 36.0–46.0)
Hemoglobin: 10 g/dL — ABNORMAL LOW (ref 12.0–15.0)
Lymphocytes Relative: 22 %
Lymphs Abs: 1.7 10*3/uL (ref 0.7–4.0)
MCH: 20.5 pg — ABNORMAL LOW (ref 26.0–34.0)
MCHC: 31.2 g/dL (ref 30.0–36.0)
MCV: 65.8 fL — ABNORMAL LOW (ref 78.0–100.0)
Monocytes Absolute: 0.5 10*3/uL (ref 0.1–1.0)
Monocytes Relative: 6 %
Neutro Abs: 5.4 10*3/uL (ref 1.7–7.7)
Neutrophils Relative %: 72 %
Platelets: 312 10*3/uL (ref 150–400)
RBC: 4.88 MIL/uL (ref 3.87–5.11)
RDW: 18.7 % — ABNORMAL HIGH (ref 11.5–15.5)
WBC: 7.6 10*3/uL (ref 4.0–10.5)

## 2015-02-25 LAB — COMPREHENSIVE METABOLIC PANEL
ALT: 15 U/L (ref 14–54)
AST: 29 U/L (ref 15–41)
Albumin: 3.8 g/dL (ref 3.5–5.0)
Alkaline Phosphatase: 58 U/L (ref 38–126)
Anion gap: 7 (ref 5–15)
BUN: 7 mg/dL (ref 6–20)
CO2: 24 mmol/L (ref 22–32)
Calcium: 8.9 mg/dL (ref 8.9–10.3)
Chloride: 108 mmol/L (ref 101–111)
Creatinine, Ser: 0.61 mg/dL (ref 0.44–1.00)
GFR calc Af Amer: >60 mL/min (ref >60)
GFR calc non Af Amer: >60 mL/min (ref >60)
Glucose, Bld: 95 mg/dL (ref 65–99)
Potassium: 3.7 mmol/L (ref 3.5–5.1)
Sodium: 139 mmol/L (ref 135–145)
Total Bilirubin: <0.1 mg/dL — ABNORMAL LOW (ref 0.3–1.2)
Total Protein: 7.4 g/dL (ref 6.5–8.1)

## 2015-02-25 LAB — URINE MICROSCOPIC-ADD ON: RBC / HPF: NONE SEEN RBC/hpf (ref 0–5)

## 2015-02-25 LAB — URINALYSIS, ROUTINE W REFLEX MICROSCOPIC
Bilirubin Urine: NEGATIVE
Glucose, UA: NEGATIVE mg/dL
Hgb urine dipstick: NEGATIVE
Ketones, ur: NEGATIVE mg/dL
Leukocytes, UA: NEGATIVE
Nitrite: NEGATIVE
Protein, ur: 30 mg/dL — AB
Specific Gravity, Urine: 1.024 (ref 1.005–1.030)
pH: 5.5 (ref 5.0–8.0)

## 2015-02-25 LAB — I-STAT BETA HCG BLOOD, ED (MC, WL, AP ONLY): I-stat hCG, quantitative: <5 m[IU]/mL (ref <5)

## 2015-02-25 LAB — POC URINE PREG, ED: Preg Test, Ur: NEGATIVE

## 2015-02-25 MED ORDER — DIAZEPAM 5 MG PO TABS
5.0000 mg | ORAL_TABLET | Freq: Once | ORAL | Status: AC
  Administered 2015-02-25: 5 mg via ORAL
  Filled 2015-02-25: qty 1

## 2015-02-25 MED ORDER — OXYCODONE-ACETAMINOPHEN 5-325 MG PO TABS
1.0000 | ORAL_TABLET | Freq: Once | ORAL | Status: AC
  Administered 2015-02-25: 1 via ORAL
  Filled 2015-02-25: qty 1

## 2015-02-25 MED ORDER — IBUPROFEN 600 MG PO TABS: 600.0000 mg | ORAL_TABLET | Freq: Four times a day (QID) | ORAL | Status: DC | PRN

## 2015-02-25 MED ORDER — DIAZEPAM 5 MG PO TABS: 5.0000 mg | ORAL_TABLET | Freq: Two times a day (BID) | ORAL | Status: DC

## 2015-02-25 MED ORDER — IBUPROFEN 200 MG PO TABS
600.0000 mg | ORAL_TABLET | Freq: Once | ORAL | Status: AC
  Administered 2015-02-25: 600 mg via ORAL
  Filled 2015-02-25: qty 3

## 2015-02-25 MED ORDER — OXYCODONE-ACETAMINOPHEN 5-325 MG PO TABS: 1.0000 | ORAL_TABLET | ORAL | Status: DC | PRN

## 2015-02-25 NOTE — ED Notes (Signed)
Waiting for physician to add scripts to discharge.
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
## 2015-02-25 NOTE — Progress Notes (Signed)
IS instruct. Education given for frequency and how to use device. Pt stated that it does hurt to take a deep breath in. I stress the importance of deep breathing. Pt achieved 1200-1400 on IS. Pt is stable at this time.
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
## 2015-02-25 NOTE — ED Notes (Signed)
Physician made aware of patient request for pain meds.

## 2015-12-08 DIAGNOSIS — O30019 Twin pregnancy, monochorionic/monoamniotic, unspecified trimester: Secondary | ICD-10-CM | POA: Insufficient documentation

## 2015-12-08 DIAGNOSIS — Z87898 Personal history of other specified conditions: Secondary | ICD-10-CM | POA: Insufficient documentation

## 2015-12-08 DIAGNOSIS — F1291 Cannabis use, unspecified, in remission: Secondary | ICD-10-CM | POA: Insufficient documentation

## 2016-02-08 ENCOUNTER — Inpatient Hospital Stay
Admission: EM | Admit: 2016-02-08 | Discharge: 2016-02-10 | DRG: 776 | Disposition: A | Discharge status: Home / Self Care | Payer: Self-pay | Attending: Internal Medicine | Admitting: Internal Medicine

## 2016-02-08 DIAGNOSIS — O165 Unspecified maternal hypertension, complicating the puerperium: Principal | ICD-10-CM | POA: Diagnosis present

## 2016-02-08 DIAGNOSIS — Z833 Family history of diabetes mellitus: Secondary | ICD-10-CM

## 2016-02-08 DIAGNOSIS — Z87891 Personal history of nicotine dependence: Secondary | ICD-10-CM

## 2016-02-08 DIAGNOSIS — R109 Unspecified abdominal pain: Secondary | ICD-10-CM | POA: Diagnosis present

## 2016-02-08 DIAGNOSIS — R51 Headache: Secondary | ICD-10-CM

## 2016-02-08 DIAGNOSIS — I160 Hypertensive urgency: Secondary | ICD-10-CM

## 2016-02-08 DIAGNOSIS — K0889 Other specified disorders of teeth and supporting structures: Secondary | ICD-10-CM | POA: Diagnosis present

## 2016-02-08 DIAGNOSIS — R519 Headache, unspecified: Secondary | ICD-10-CM

## 2016-02-08 DIAGNOSIS — I639 Cerebral infarction, unspecified: Secondary | ICD-10-CM

## 2016-02-08 DIAGNOSIS — G8918 Other acute postprocedural pain: Secondary | ICD-10-CM | POA: Diagnosis present

## 2016-02-08 DIAGNOSIS — I10 Essential (primary) hypertension: Secondary | ICD-10-CM | POA: Diagnosis present

## 2016-02-08 DIAGNOSIS — I161 Hypertensive emergency: Secondary | ICD-10-CM | POA: Diagnosis present

## 2016-02-08 LAB — CBC WITH DIFFERENTIAL/PLATELET
Basophils Absolute: 0.1 10*3/uL (ref 0–0.1)
Basophils Relative: 2 %
Eosinophils Absolute: 0.2 10*3/uL (ref 0–0.7)
Eosinophils Relative: 4 %
HCT: 35.8 % (ref 35.0–47.0)
Hemoglobin: 11.9 g/dL — ABNORMAL LOW (ref 12.0–16.0)
Lymphocytes Relative: 20 %
Lymphs Abs: 1.2 10*3/uL (ref 1.0–3.6)
MCH: 25.9 pg — ABNORMAL LOW (ref 26.0–34.0)
MCHC: 33.3 g/dL (ref 32.0–36.0)
MCV: 77.8 fL — ABNORMAL LOW (ref 80.0–100.0)
Monocytes Absolute: 0.4 10*3/uL (ref 0.2–0.9)
Monocytes Relative: 6 %
Neutro Abs: 4.2 10*3/uL (ref 1.4–6.5)
Neutrophils Relative %: 68 %
Platelets: 178 10*3/uL (ref 150–440)
RBC: 4.59 MIL/uL (ref 3.80–5.20)
RDW: 20.1 % — ABNORMAL HIGH (ref 11.5–14.5)
WBC: 6.1 10*3/uL (ref 3.6–11.0)

## 2016-02-08 LAB — COMPREHENSIVE METABOLIC PANEL
ALT: 11 U/L — ABNORMAL LOW (ref 14–54)
AST: 17 U/L (ref 15–41)
Albumin: 3.1 g/dL — ABNORMAL LOW (ref 3.5–5.0)
Alkaline Phosphatase: 75 U/L (ref 38–126)
Anion gap: 6 (ref 5–15)
BUN: 13 mg/dL (ref 6–20)
CO2: 26 mmol/L (ref 22–32)
Calcium: 8.5 mg/dL — ABNORMAL LOW (ref 8.9–10.3)
Chloride: 112 mmol/L — ABNORMAL HIGH (ref 101–111)
Creatinine, Ser: 0.88 mg/dL (ref 0.44–1.00)
GFR calc Af Amer: >60 mL/min (ref >60)
GFR calc non Af Amer: >60 mL/min (ref >60)
Glucose, Bld: 88 mg/dL (ref 65–99)
Potassium: 3.8 mmol/L (ref 3.5–5.1)
Sodium: 144 mmol/L (ref 135–145)
Total Bilirubin: <0.1 mg/dL — ABNORMAL LOW (ref 0.3–1.2)
Total Protein: 6.7 g/dL (ref 6.5–8.1)

## 2016-02-08 LAB — URINALYSIS COMPLETE WITH MICROSCOPIC (ARMC ONLY)
Bilirubin Urine: NEGATIVE
Glucose, UA: NEGATIVE mg/dL
Ketones, ur: NEGATIVE mg/dL
Nitrite: NEGATIVE
Protein, ur: NEGATIVE mg/dL
Specific Gravity, Urine: 1.009 (ref 1.005–1.030)
pH: 7 (ref 5.0–8.0)

## 2016-02-08 LAB — TROPONIN I: Troponin I: <0.03 ng/mL (ref <0.03)

## 2016-02-08 LAB — PROTIME-INR
INR: 1.03
Prothrombin Time: 13.5 seconds (ref 11.4–15.2)

## 2016-02-08 LAB — GLUCOSE, CAPILLARY: Glucose-Capillary: 81 mg/dL (ref 65–99)

## 2016-02-08 LAB — MAGNESIUM: Magnesium: 1.4 mg/dL — ABNORMAL LOW (ref 1.7–2.4)

## 2016-02-08 MED ORDER — MAGNESIUM SULFATE 2 GM/50ML IV SOLN: 2.0000 g | Freq: Once | INTRAVENOUS | Status: DC

## 2016-02-08 MED ORDER — LABETALOL HCL 200 MG PO TABS
200.0000 mg | ORAL_TABLET | ORAL | Status: AC
  Administered 2016-02-08: 200 mg via ORAL
  Filled 2016-02-08: qty 1

## 2016-02-08 MED ORDER — DOCUSATE SODIUM 100 MG PO CAPS
100.0000 mg | ORAL_CAPSULE | Freq: Two times a day (BID) | ORAL | Status: DC | PRN
  Filled 2016-02-08: qty 1

## 2016-02-08 MED ORDER — KETOROLAC TROMETHAMINE 30 MG/ML IJ SOLN: 15.0000 mg | Freq: Once | INTRAMUSCULAR | Status: DC

## 2016-02-08 MED ORDER — SODIUM CHLORIDE 0.9 % IV BOLUS (SEPSIS)
500.0000 mL | INTRAVENOUS | Status: AC
  Administered 2016-02-08: 500 mL via INTRAVENOUS

## 2016-02-08 MED ORDER — ONDANSETRON HCL 4 MG PO TABS
4.0000 mg | ORAL_TABLET | Freq: Four times a day (QID) | ORAL | Status: DC | PRN
  Administered 2016-02-10: 4 mg via ORAL
  Filled 2016-02-08: qty 1

## 2016-02-08 MED ORDER — ONDANSETRON HCL 4 MG/2ML IJ SOLN: 4.0000 mg | Freq: Four times a day (QID) | INTRAMUSCULAR | Status: DC | PRN

## 2016-02-08 MED ORDER — ENOXAPARIN SODIUM 40 MG/0.4ML ~~LOC~~ SOLN
40.0000 mg | Freq: Every day | SUBCUTANEOUS | Status: DC
  Filled 2016-02-08: qty 0.4

## 2016-02-08 MED ORDER — MAGNESIUM SULFATE 4 GM/100ML IV SOLN
4.0000 g | Freq: Once | INTRAVENOUS | Status: AC
  Administered 2016-02-08: 4 g via INTRAVENOUS
  Filled 2016-02-08: qty 100

## 2016-02-08 MED ORDER — LABETALOL HCL 5 MG/ML IV SOLN
10.0000 mg | Freq: Once | INTRAVENOUS | Status: AC
  Administered 2016-02-08: 10 mg via INTRAVENOUS
  Filled 2016-02-08: qty 4

## 2016-02-08 MED ORDER — NICARDIPINE HCL IN NACL 20-0.86 MG/200ML-% IV SOLN
3.0000 mg/h | Freq: Once | INTRAVENOUS | Status: AC
  Administered 2016-02-08: 5 mg/h via INTRAVENOUS
  Filled 2016-02-08: qty 200

## 2016-02-08 MED ORDER — NICARDIPINE HCL IN NACL 20-0.86 MG/200ML-% IV SOLN
3.0000 mg/h | INTRAVENOUS | Status: DC
  Administered 2016-02-09 (×2): 3 mg/h via INTRAVENOUS
  Filled 2016-02-08 (×2): qty 200

## 2016-02-08 MED ORDER — ACETAMINOPHEN 325 MG PO TABS: 650.0000 mg | ORAL_TABLET | Freq: Four times a day (QID) | ORAL | Status: DC | PRN

## 2016-02-08 MED ORDER — SODIUM CHLORIDE 0.9% FLUSH
3.0000 mL | Freq: Two times a day (BID) | INTRAVENOUS | Status: DC
  Administered 2016-02-09 (×2): 3 mL via INTRAVENOUS

## 2016-02-08 MED ORDER — OXYCODONE-ACETAMINOPHEN 5-325 MG PO TABS
2.0000 | ORAL_TABLET | Freq: Once | ORAL | Status: AC
  Administered 2016-02-08: 2 via ORAL
  Filled 2016-02-08: qty 2

## 2016-02-08 MED ORDER — DIPHENHYDRAMINE HCL 50 MG/ML IJ SOLN: 25.0000 mg | Freq: Once | INTRAMUSCULAR | Status: DC

## 2016-02-08 MED ORDER — OXYCODONE HCL 5 MG PO TABS
5.0000 mg | ORAL_TABLET | ORAL | Status: DC | PRN
  Administered 2016-02-09 (×3): 5 mg via ORAL
  Filled 2016-02-08 (×3): qty 1

## 2016-02-08 MED ORDER — METOCLOPRAMIDE HCL 5 MG/ML IJ SOLN: 10.0000 mg | INTRAMUSCULAR | Status: DC

## 2016-02-08 MED ORDER — ACETAMINOPHEN 650 MG RE SUPP
650.0000 mg | Freq: Four times a day (QID) | RECTAL | Status: DC | PRN
  Filled 2016-02-08: qty 1

## 2016-02-08 MED ORDER — IBUPROFEN 400 MG PO TABS
400.0000 mg | ORAL_TABLET | Freq: Four times a day (QID) | ORAL | Status: DC | PRN
  Administered 2016-02-10: 400 mg via ORAL
  Filled 2016-02-08 (×2): qty 1

## 2016-02-08 NOTE — ED Triage Notes (Signed)
Pt [facility], pt is c/o c-section site pain, pt does not have a follow up apt.. Pt also c/o left upper toothache with HA.Marland Kitchen. Pt states she did have to stay 2 extra days post op for pre eclampsia. Pt is HTN in triage.

## 2016-02-08 NOTE — H&P (Signed)
[facility] [place] [facility]   PATIENT NAME: Laurie BecketBrittany Zenker    MR#:  [id number]  DATE OF BIRTH:  May 03, 1990  DATE OF ADMISSION:  02/08/2016  PRIMARY CARE PHYSICIAN: No PCP Per Patient   REQUESTING/REFERRING PHYSICIAN: York CeriseForbach, MD  CHIEF COMPLAINT:   Chief Complaint  Patient presents with  . Post-op Problem    HISTORY OF PRESENT ILLNESS:  Laurie Petersen  is a 25 y.o. female who presents with 3 days of headaches. Patient is several weeks out from cesarean section delivery of her twins. On presentation the ED here tonight she had significantly elevated blood pressure. She was given magnesium and IV labetalol which controlled her blood pressure and her headache improved. However, shortly after the magnesium drip ended her blood pressure went back up. Subsequent doses of labetalol were of minimal to no effect. She was started on a Cardene drip and hospitalists were called for admission.  PAST MEDICAL HISTORY:   Past Medical History:  Diagnosis Date  . Eclampsia     PAST SURGICAL HISTORY:   Past Surgical History:  Procedure Laterality Date  . CESAREAN SECTION    . TONSILLECTOMY      SOCIAL HISTORY:   Social History  Substance Use Topics  . Smoking status: Former Smoker    Packs/day: 0.50    Types: Cigarettes  . Smokeless tobacco: Never Used  . Alcohol use No    FAMILY HISTORY:   Family History  Problem Relation Age of Onset  . Arthritis Maternal Grandmother   . Diabetes Maternal Grandmother     DRUG ALLERGIES:  No Known Allergies  MEDICATIONS AT HOME:   Prior to Admission medications   Medication Sig Start Date End Date Taking? Authorizing Provider  acetaminophen (TYLENOL) 325 MG tablet Take 650 mg by mouth every 6 (six) hours. 01/28/16  Yes Historical Provider, MD  docusate sodium (COLACE) 100 MG capsule Take 100 mg by mouth 2 (two) times daily as needed for moderate constipation.  01/28/16 02/27/16 Yes Historical Provider,  MD  ibuprofen (ADVIL,MOTRIN) 600 MG tablet Take 600 mg by mouth every 6 (six) hours. 01/28/16  Yes Historical Provider, MD    REVIEW OF SYSTEMS:  Review of Systems  Constitutional: Negative for chills, fever, malaise/fatigue and weight loss.  HENT: Negative for ear pain, hearing loss and tinnitus.   Eyes: Negative for blurred vision, double vision, pain and redness.  Respiratory: Negative for cough, hemoptysis and shortness of breath.   Cardiovascular: Negative for chest pain, palpitations, orthopnea and leg swelling.  Gastrointestinal: Negative for abdominal pain, constipation, diarrhea, nausea and vomiting.  Genitourinary: Negative for dysuria, frequency and hematuria.  Musculoskeletal: Negative for back pain, joint pain and neck pain.  Skin:       No acne, rash, or lesions  Neurological: Positive for headaches. Negative for dizziness, tremors, focal weakness and weakness.  Endo/Heme/Allergies: Negative for polydipsia. Does not bruise/bleed easily.  Psychiatric/Behavioral: Negative for depression. The patient is not nervous/anxious and does not have insomnia.      VITAL SIGNS:   Vitals:   02/08/16 2145 02/08/16 2158 02/08/16 2200 02/08/16 2215  BP: (!) 203/112 (!) 194/112 (!) 195/108 (!) 147/97  Pulse: (!) 55 (!) 57 62 63  Resp:   13   Temp:      TempSrc:      SpO2: 100% 99% 99% 99%  Weight:      Height:       Wt Readings from Last 3 Encounters:  02/08/16 70.3  kg (155 lb)  02/25/15 63.5 kg (140 lb)  02/13/15 63.5 kg (140 lb)    PHYSICAL EXAMINATION:  Physical Exam  Vitals reviewed. Constitutional: She is oriented to person, place, and time. She appears well-developed and well-nourished. No distress.  HENT:  Head: Normocephalic and atraumatic.  Mouth/Throat: Oropharynx is clear and moist.  Eyes: Conjunctivae and EOM are normal. Pupils are equal, round, and reactive to light. No scleral icterus.  Neck: Normal range of motion. Neck supple. No JVD present. No thyromegaly  present.  Cardiovascular: Normal rate, regular rhythm and intact distal pulses.  Exam reveals no gallop and no friction rub.   No murmur heard. Respiratory: Effort normal and breath sounds normal. No respiratory distress. She has no wheezes. She has no rales.  GI: Soft. Bowel sounds are normal. She exhibits no distension. There is no tenderness.  Musculoskeletal: Normal range of motion. She exhibits no edema.  No arthritis, no gout  Lymphadenopathy:    She has no cervical adenopathy.  Neurological: She is alert and oriented to person, place, and time. No cranial nerve deficit.  No dysarthria, no aphasia  Skin: Skin is warm and dry. No rash noted. No erythema.  Psychiatric: She has a normal mood and affect. Her behavior is normal. Judgment and thought content normal.    LABORATORY PANEL:   CBC  Recent Labs Lab 02/08/16 1636  WBC 6.1  HGB 11.9*  HCT 35.8  PLT 178   ------------------------------------------------------------------------------------------------------------------  Chemistries   Recent Labs Lab 02/08/16 1636  NA 144  K 3.8  CL 112*  CO2 26  GLUCOSE 88  BUN 13  CREATININE 0.88  CALCIUM 8.5*  MG 1.4*  AST 17  ALT 11*  ALKPHOS 75  BILITOT <0.1*   ------------------------------------------------------------------------------------------------------------------  Cardiac Enzymes  Recent Labs Lab 02/08/16 1636  TROPONINI <0.03   ------------------------------------------------------------------------------------------------------------------  RADIOLOGY:  No results found.  EKG:   Orders placed or performed during the hospital encounter of 02/08/16  . ED EKG  . ED EKG    IMPRESSION AND PLAN:  Principal Problem:   Accelerated hypertension - controlled well on Cardene drip, continue this for now. She will likely need initiation of oral calcium channel blocker in the morning.  All the records are reviewed and case discussed with ED  provider. Management plans discussed with the patient and/or family.  DVT PROPHYLAXIS: SubQ lovenox  GI PROPHYLAXIS: None  ADMISSION STATUS: Observation  CODE STATUS: Full Code Status History    This patient does not have a recorded code status. Please follow your organizational policy for patients in this situation.      TOTAL TIME TAKING CARE OF THIS PATIENT: 40 minutes.    Shedrick Sarli FIELDING 02/08/2016, 10:22 PM  [facility]  Office  [phone]  CC: Primary care physician; No PCP Per Patient

## 2016-02-08 NOTE — ED Notes (Addendum)
Dr. York CeriseForbach aware of rising BP.  No further orders received. Pt took PO labetalol. Will continue to reasses.

## 2016-02-08 NOTE — Progress Notes (Signed)
Pt denies blurry vision or epigastric pain. 0 beats of clonus noted. Popliteal deep tendon reflexes WNL. No edema noted to bilat lower extremities.  BP is WNL on cardene gtt at this time. Pt denies HA. Instructed pt to notify RN for hA, blurred vision or epigastric pain.

## 2016-02-08 NOTE — ED Notes (Signed)
Discussed increasing BP with Dr. York CeriseForbach, orders placed and pharmacy called to send medication.

## 2016-02-08 NOTE — ED Notes (Signed)
ED Provider at bedside. 

## 2016-02-08 NOTE — ED Provider Notes (Signed)
[facility] Emergency Department Provider Note  ____________________________________________   First MD Initiated Contact with Patient 02/08/16 1623     (approximate)  I have reviewed the triage vital signs and the nursing notes.   HISTORY  Chief Complaint Post-op Problem    HPI Laurie Petersen is a 25 y.o. female who presents for evaluation of severe left-sided headache, pain around the site of her C-section scar, and left upper rear molar toothache.  She has been at [facility] in the hospital for 2 months prior to having a C-section for a twin gestation at 34 weeks exactly 2 weeks ago today.  Because she and her husband live in [place] the babies were transferred just recently to the NICU here at [facility].  She does not have any follow-up scheduled with [facility] because she is now back in [place] as well.  She reports that she has had gradually worsening severe throbbing and sharp stabbing headache primarily on the left side of her head over the last 2-3 days.  She states that nothing makes it better and nothing makes it worse.  She denies visual changes, photophobia, dizziness/lightheadedness.  She denies chest pain, shortness of breath, nausea, vomiting.    She describes the abdominal pain around her surgical site as worse with movement.  It has been present since the surgery but states that it is more noticeable now because she ran out of pain medicine.  She also reports that she has the severe pain in her right rear molar.  She has had problems with a tooth for a long time but it only started hurting her over the last couple days.  She has had no swelling around the site and no difficulty swallowing, just pain that is aching and worse with hot and cold.   Past Medical History:  Diagnosis Date  . Eclampsia     There are no active problems to display for this patient.   Past Surgical History:  Procedure Laterality Date  . CESAREAN  SECTION    . TONSILLECTOMY      Prior to Admission medications   Medication Sig Start Date End Date Taking? Authorizing Provider  acetaminophen (TYLENOL) 325 MG tablet Take 650 mg by mouth every 6 (six) hours. 01/28/16  Yes Historical Provider, MD  docusate sodium (COLACE) 100 MG capsule Take 100 mg by mouth 2 (two) times daily as needed for moderate constipation.  01/28/16 02/27/16 Yes Historical Provider, MD  ibuprofen (ADVIL,MOTRIN) 600 MG tablet Take 600 mg by mouth every 6 (six) hours. 01/28/16  Yes Historical Provider, MD    Allergies Patient has no known allergies.  No family history on file.  Social History Social History  Substance Use Topics  . Smoking status: Former Smoker    Packs/day: 0.50    Types: Cigarettes  . Smokeless tobacco: Never Used  . Alcohol use No    Review of Systems Constitutional: No fever/chills Eyes: No visual changes. ENT: No sore throat. Cardiovascular: Denies chest pain. Respiratory: Denies shortness of breath. Gastrointestinal: Pain at the side of her surgical incision.  No nausea, no vomiting.  No diarrhea.  No constipation. Genitourinary: Negative for dysuria. Musculoskeletal: Negative for back pain. Skin: Negative for rash. Neurological: Severe headache for 2-3 days, worse on the left side.  No numbness or weakness in either of her extremities  10-point ROS otherwise negative.  ____________________________________________   PHYSICAL EXAM:  VITAL SIGNS: ED Triage Vitals  Enc Vitals Group  BP 02/08/16 1604 (!) 153/115     Pulse Rate 02/08/16 1604 74     Resp 02/08/16 1604 18     Temp 02/08/16 1604 97.5 F (36.4 C)     Temp Source 02/08/16 1604 Oral     SpO2 02/08/16 1604 99 %     Weight 02/08/16 1604 155 lb (70.3 kg)     Height 02/08/16 1604 5\' 6"  (1.676 m)     Head Circumference --      Peak Flow --      Pain Score 02/08/16 1605 10     Pain Loc --      Pain Edu? --      Excl. in GC? --     Constitutional: Alert  and oriented. Well appearing and in no acute distress but does appear uncomfortable Eyes: Conjunctivae are normal. PERRL. EOMI. Head: Atraumatic. Nose: No congestion/rhinnorhea. Mouth/Throat: Mucous membranes are moist.  Oropharynx non-erythematous. Neck: No stridor.  No meningeal signs.   Cardiovascular: Normal rate, regular rhythm. Good peripheral circulation. Grossly normal heart sounds. Respiratory: Normal respiratory effort.  No retractions. Lungs CTAB. Gastrointestinal: Soft with mild tenderness to palpation around the surgical scar.  Steri-Strips are still in place over most of the incision site but what is visible is healthy and well-appearing.  There is some induration right directly at the incision consistent with scar tissue, but there is no fluctuance, drainage, or wound dehiscence.  There is no palpable hematomas and there does not appear to be any abscesses based on external palpation. Musculoskeletal: No lower extremity tenderness nor edema. No gross deformities of extremities. Neurologic:  Normal speech and language. No gross focal neurologic deficits are appreciated.  Skin:  Skin is warm, dry and intact. No rash noted. Psychiatric: Mood and affect are normal. Speech and behavior are normal.  ____________________________________________   LABS (all labs ordered are listed, but only abnormal results are displayed)  Labs Reviewed  URINALYSIS COMPLETEWITH MICROSCOPIC ([facility] ONLY) - Abnormal; Notable for the following:       Result Value   Color, Urine STRAW (*)    APPearance CLEAR (*)    Hgb urine dipstick 3+ (*)    Leukocytes, UA 2+ (*)    Bacteria, UA RARE (*)    Squamous Epithelial / LPF 6-30 (*)    All other components within normal limits  COMPREHENSIVE METABOLIC PANEL - Abnormal; Notable for the following:    Chloride 112 (*)    Calcium 8.5 (*)    Albumin 3.1 (*)    ALT 11 (*)    Total Bilirubin <0.1 (*)    All other components within normal limits  CBC WITH  DIFFERENTIAL/PLATELET - Abnormal; Notable for the following:    Hemoglobin 11.9 (*)    MCV 77.8 (*)    MCH 25.9 (*)    RDW 20.1 (*)    All other components within normal limits  MAGNESIUM - Abnormal; Notable for the following:    Magnesium 1.4 (*)    All other components within normal limits  URINE CULTURE  PROTIME-INR  TROPONIN I   ____________________________________________  EKG  ED ECG REPORT I, Makeda Peeks, the attending physician, personally viewed and interpreted this ECG.  Date: 02/08/2016 EKG Time: 21:59 Rate: 54 Rhythm: Sinus bradycardia QRS Axis: normal Intervals: normal ST/T Wave abnormalities: normal Conduction Disturbances: none Narrative Interpretation: unremarkable  ____________________________________________  RADIOLOGY   No results found.  ____________________________________________   PROCEDURES  Procedure(s) performed:   .Critical Care Performed by: Loleta RoseFORBACH, Lonzo Saulter  Authorized by: Loleta Rose   Critical care provider statement:    Critical care time (minutes):  45   Critical care time was exclusive of:  Separately billable procedures and treating other patients   Critical care was necessary to treat or prevent imminent or life-threatening deterioration of the following conditions:  CNS failure or compromise and metabolic crisis   Critical care was time spent personally by me on the following activities:  Development of treatment plan with patient or surrogate, discussions with consultants, evaluation of patient's response to treatment, examination of patient, obtaining history from patient or surrogate, ordering and performing treatments and interventions, ordering and review of laboratory studies, ordering and review of radiographic studies, pulse oximetry, re-evaluation of patient's condition and review of old charts      Critical Care performed: Yes, see critical care procedure  note(s) ____________________________________________   INITIAL IMPRESSION / ASSESSMENT AND PLAN / ED COURSE  Pertinent labs & imaging results that were available during my care of the patient were reviewed by me and considered in my medical decision making (see chart for details).  The patient has 3 or more consistently elevated blood pressures which are over 160 and even over 180 systolic as well as significantly over 100 and even 110 diastolic.  this is very concerning for postpartum eclampsia in the setting of a new onset severe headache. She states that she was An exta day at [facility] due to an elevated blood pressure but her blood pressure was in the 120s on the day that she was discharged.  She is alert and oriented but does appear uncomfortable.  As per standard recommendations, I will treat with magnesium (starting with 4 g IV) and labetalol 10 mg IV and will give another dose if she remains elevated over 160 systolic after the initial treatment.  Labs and urinalysis are pending.  I anticipate consulting Dr. Tiburcio Pea with [facility] for further management assistance.    regarding her abdominal wound and tooth, neither appeign of acute infection.  I believe she is experiencing nrmal postoperative discomfort and at this point I am going to focus on managing her postpartum preeclampsia.  Of note, unfortunately there is a malfunction with the care everywhere connection to [facility] and we cannot download notes from her recent hospitalization at this time.   Clinical Course as of Feb 07 2205  Tue Feb 08, 2016  1742 I called and spoke by phone with Dr. Tiburcio Pea after seeing the reassuring lab results which include no proteinuria and a normal creatinine.  He feels this would be an extraordinarily unusual presentation of postpartum preeclampsia given that it has been 2 weeks since the delivery.  Usually we would expect to see her eclampsia within the first 48 hours or at the most a couple of days postpartum.  He  suggested managing her as any other hypertension but considering labetalol as an outpatient if she goes home versus inpatient management.  However, at this point she does not meet any inpatient criteria for hypertensive urgency/emergency.  I have added on a troponin to look for any other sign of end organ dysfunction and currently she reports that her headache is much better after her blood pressure has come down to the 130s systolic.  She is still getting the magnesium and I will watch to see if her blood pressure immediately goes back up after the magnesium is finished.  I anticipate discharge with outpatient follow-up with Dr. Tiburcio Pea and a new primary care doctor with oral  labetalol as an outpatient medication.  [CF]  1938 Her blood pressure started trending upward about 20 minutes after finishing the magnesium.  I then ordered labetalol 200 mg oral.  It has been about 20-30 minutes and now her blood pressure is all the way up to 192/118 and her pain is back to 10 out of 10.  I am giving her labetalol 10 mg IV and Percocet 2 tablets by mouth.  Her troponin is still pending but at this point given that she is symptomatic with her headache and her blood pressure is not controllable she may require admission.  I will monitor see how she responds to the current medication.  [CF]  2106 BP still in the upper 180s / 110.  Will admit for further management of hypertensive urgency, headache, etc.  [CF]  2127 Spoke in person with Dr. Anne Hahn.  He agreed with my plan for nicardipine and admission.  [CF]    Clinical Course User Index [CF] Loleta Rose, MD    ____________________________________________  FINAL CLINICAL IMPRESSION(S) / ED DIAGNOSES  Final diagnoses:  Hypertensive urgency  Acute intractable headache, unspecified headache type  Toothache  Postoperative abdominal pain     MEDICATIONS GIVEN DURING THIS VISIT:  Medications  nicardipine (CARDENE) 20mg  in 0.86% saline IV infusion (0.1  mg/ml) (not administered)  sodium chloride 0.9 % bolus 500 mL (0 mLs Intravenous Stopped 02/08/16 1810)  magnesium sulfate IVPB 4 g 100 mL (0 g Intravenous Stopped 02/08/16 1712)  labetalol (NORMODYNE,TRANDATE) injection 10 mg (10 mg Intravenous Given 02/08/16 1645)  labetalol (NORMODYNE) tablet 200 mg (200 mg Oral Given 02/08/16 1854)  labetalol (NORMODYNE,TRANDATE) injection 10 mg (10 mg Intravenous Given 02/08/16 1943)  oxyCODONE-acetaminophen (PERCOCET/ROXICET) 5-325 MG per tablet 2 tablet (2 tablets Oral Given 02/08/16 1942)     NEW OUTPATIENT MEDICATIONS STARTED DURING THIS VISIT:  New Prescriptions   No medications on file    Modified Medications   No medications on file    Discontinued Medications   DIAZEPAM (VALIUM) 5 MG TABLET    Take 1 tablet (5 mg total) by mouth 2 (two) times daily.   HYDROCODONE-ACETAMINOPHEN (NORCO/VICODIN) 5-325 MG TABLET    Take 1 tablet by mouth every 4 (four) hours as needed for moderate pain.   IBUPROFEN (ADVIL,MOTRIN) 600 MG TABLET    Take 1 tablet (600 mg total) by mouth every 6 (six) hours as needed.   OXYCODONE-ACETAMINOPHEN (PERCOCET/ROXICET) 5-325 MG TABLET    Take 1 tablet by mouth every 4 (four) hours as needed for severe pain.   SULFAMETHOXAZOLE-TRIMETHOPRIM (BACTRIM DS,SEPTRA DS) 800-160 MG TABLET    Take 1 tablet by mouth 2 (two) times daily.     Note:  This document was prepared using Dragon voice recognition software and may include unintentional dictation errors.    Loleta Rose, MD 02/08/16 2206

## 2016-02-08 NOTE — ED Notes (Addendum)
Pt states that she came to ER originally for surgical site pain, headache and toothache. Pt postpartum c-section X 2 weeks, had 2 high BP readings before discharge and was dx with preeclampsia. Denies having high BP during pregnancy. Pt out of PO pain medication for c section pain. Pt alert and oriented X4, active, cooperative, pt in NAD. RR even and unlabored, color WNL.  Pt ambulatory back to room.

## 2016-02-09 DIAGNOSIS — I161 Hypertensive emergency: Secondary | ICD-10-CM | POA: Diagnosis present

## 2016-02-09 LAB — BASIC METABOLIC PANEL
Anion gap: 10 (ref 5–15)
BUN: 8 mg/dL (ref 6–20)
CO2: 27 mmol/L (ref 22–32)
Calcium: 8.4 mg/dL — ABNORMAL LOW (ref 8.9–10.3)
Chloride: 106 mmol/L (ref 101–111)
Creatinine, Ser: 0.73 mg/dL (ref 0.44–1.00)
GFR calc Af Amer: >60 mL/min (ref >60)
GFR calc non Af Amer: >60 mL/min (ref >60)
Glucose, Bld: 82 mg/dL (ref 65–99)
Potassium: 3.2 mmol/L — ABNORMAL LOW (ref 3.5–5.1)
Sodium: 143 mmol/L (ref 135–145)

## 2016-02-09 LAB — CBC
HCT: 36.4 % (ref 35.0–47.0)
Hemoglobin: 12.1 g/dL (ref 12.0–16.0)
MCH: 25.7 pg — ABNORMAL LOW (ref 26.0–34.0)
MCHC: 33.3 g/dL (ref 32.0–36.0)
MCV: 77.3 fL — ABNORMAL LOW (ref 80.0–100.0)
Platelets: 185 10*3/uL (ref 150–440)
RBC: 4.71 MIL/uL (ref 3.80–5.20)
RDW: 20 % — ABNORMAL HIGH (ref 11.5–14.5)
WBC: 5.7 10*3/uL (ref 3.6–11.0)

## 2016-02-09 LAB — MAGNESIUM: Magnesium: 1.7 mg/dL (ref 1.7–2.4)

## 2016-02-09 LAB — MRSA PCR SCREENING: MRSA by PCR: NEGATIVE

## 2016-02-09 MED ORDER — AMLODIPINE BESYLATE 10 MG PO TABS
10.0000 mg | ORAL_TABLET | Freq: Every day | ORAL | Status: DC
  Administered 2016-02-09: 10 mg via ORAL
  Filled 2016-02-09: qty 1

## 2016-02-09 MED ORDER — POTASSIUM CHLORIDE CRYS ER 20 MEQ PO TBCR
40.0000 meq | EXTENDED_RELEASE_TABLET | Freq: Once | ORAL | Status: AC
  Administered 2016-02-09: 40 meq via ORAL
  Filled 2016-02-09: qty 2

## 2016-02-09 MED ORDER — OXYCODONE HCL 5 MG PO TABS
10.0000 mg | ORAL_TABLET | ORAL | Status: DC | PRN
  Administered 2016-02-09 – 2016-02-10 (×2): 10 mg via ORAL
  Filled 2016-02-09 (×2): qty 2

## 2016-02-09 MED ORDER — LABETALOL HCL 200 MG PO TABS
300.0000 mg | ORAL_TABLET | Freq: Three times a day (TID) | ORAL | Status: DC
  Administered 2016-02-09 – 2016-02-10 (×4): 300 mg via ORAL
  Filled 2016-02-09: qty 2
  Filled 2016-02-09 (×3): qty 1

## 2016-02-09 MED ORDER — MAGNESIUM SULFATE 2 GM/50ML IV SOLN
2.0000 g | Freq: Once | INTRAVENOUS | Status: AC
  Administered 2016-02-09: 2 g via INTRAVENOUS
  Filled 2016-02-09: qty 50

## 2016-02-09 NOTE — Progress Notes (Signed)
CONSULT NOTE  Pharmacy Consult to assist in electrolyte management in this [age] yoF who recently had a c-section admitted for hypertensive emergency.  Plan: 11/8 0600 K+ = 3.2 Mag = 1.7 Patient received magnesium 2 g IV once and KCl 40 mEq PO x 1. Will f/u with am labs and replace as needed  No Known Allergies  Patient Measurements: Height: 5\' 6"  (167.6 cm) Weight: 160 lb 0.9 oz (72.6 kg) IBW/kg (Calculated) : 59.3  Vital Signs: Temp: 98.5 F (36.9 C) (11/08 1349) Temp Source: Oral (11/08 1349) BP: 118/86 (11/08 1349) Pulse Rate: 83 (11/08 1349) Intake/Output from previous day: 11/07 0701 - 11/08 0700 In: 748.3 [I.V.:198.3; IV Piggyback:550] Out: -  Intake/Output from this shift: Total I/O In: 206 [I.V.:156; IV Piggyback:50] Out: -   Labs:  Recent Labs  02/08/16 1636 02/09/16 0612  WBC 6.1 5.7  HGB 11.9* 12.1  HCT 35.8 36.4  PLT 178 185  INR 1.03  --      Recent Labs  02/08/16 1636 02/09/16 0612  NA 144 143  K 3.8 3.2*  CL 112* 106  CO2 26 27  GLUCOSE 88 82  BUN 13 8  CREATININE 0.88 0.73  CALCIUM 8.5* 8.4*  MG 1.4* 1.7  PROT 6.7  --   ALBUMIN 3.1*  --   AST 17  --   ALT 11*  --   ALKPHOS 75  --   BILITOT <0.1*  --    Estimated Creatinine Clearance: 107.7 mL/min (by C-G formula based on SCr of 0.73 mg/dL).    Recent Labs  02/08/16 2318  GLUCAP 81    Medications:  Scheduled:  . amLODipine  10 mg Oral Daily  . enoxaparin (LOVENOX) injection  40 mg Subcutaneous QHS  . labetalol  300 mg Oral TID  . sodium chloride flush  3 mL Intravenous Q12H   Infusions:  . niCARDipine Stopped (02/09/16 1119)    Horris LatinoHolly Hava Massingale, PharmD Pharmacy Resident 02/09/2016 3:36 PM

## 2016-02-09 NOTE — Progress Notes (Signed)
Spoke with Eliane DecreeS. Patel MD on telephone. Okay to go to mother baby unit, telemetry discontinued.

## 2016-02-09 NOTE — Progress Notes (Signed)
[facility] [place] [facility]                                                                                                                                                                                            Patient Demographics   Laurie BecketBrittany Windish, is a 25 y.o. female, DOB - 21-Sep-1990, ZOX:[id number]  Admit date - 02/08/2016   Admitting Physician Oralia Manisavid Willis, MD  Outpatient Primary MD for the patient is No PCP Per Patient   LOS - 0  Subjective: Patient admitted with hypertensive emergency. Blood pressure now improved continues to complain of temporal headache. States is different from her headache yesterday.    Review of Systems:   CONSTITUTIONAL: No documented fever. No fatigue, weakness. No weight gain, no weight loss.  EYES: No blurry or double vision.  ENT: No tinnitus. No postnasal drip. No redness of the oropharynx.  RESPIRATORY: No cough, no wheeze, no hemoptysis. No dyspnea.  CARDIOVASCULAR: No chest pain. No orthopnea. No palpitations. No syncope.  GASTROINTESTINAL: No nausea, no vomiting or diarrhea. No abdominal pain. No melena or hematochezia.  GENITOURINARY: No dysuria or hematuria.  ENDOCRINE: No polyuria or nocturia. No heat or cold intolerance.  HEMATOLOGY: No anemia. No bruising. No bleeding.  INTEGUMENTARY: No rashes. No lesions.  MUSCULOSKELETAL: No arthritis. No swelling. No gout.  NEUROLOGIC: No numbness, tingling, or ataxia. No seizure-type activity. Positive headache PSYCHIATRIC: No anxiety. No insomnia. No ADD.    Vitals:   Vitals:   02/09/16 1051 02/09/16 1100 02/09/16 1200 02/09/16 1349  BP:  116/72 116/65 118/86  Pulse: 85   83  Resp:    18  Temp:    98.5 F (36.9 C)  TempSrc:    Oral  SpO2:    99%  Weight:      Height:        Wt Readings from Last 3 Encounters:  02/08/16 160 lb 0.9 oz (72.6 kg)  02/25/15 140 lb (63.5 kg)  02/13/15 140 lb (63.5 kg)     Intake/Output Summary (Last 24 hours) at  02/09/16 1548 Last data filed at 02/09/16 1200  Gross per 24 hour  Intake           954.33 ml  Output                0 ml  Net           954.33 ml    Physical Exam:   GENERAL: Pleasant-appearing in no apparent distress.  HEAD, EYES, EARS, NOSE AND THROAT: Atraumatic, normocephalic. Extraocular muscles are intact. Pupils equal and reactive to light. Sclerae anicteric.  No conjunctival injection. No oro-pharyngeal erythema.  NECK: Supple. There is no jugular venous distention. No bruits, no lymphadenopathy, no thyromegaly.  HEART: Regular rate and rhythm,. No murmurs, no rubs, no clicks.  LUNGS: Clear to auscultation bilaterally. No rales or rhonchi. No wheezes.  ABDOMEN: Soft, flat, nontender, nondistended. Has good bowel sounds. No hepatosplenomegaly appreciated.  EXTREMITIES: No evidence of any cyanosis, clubbing, or peripheral edema.  +2 pedal and radial pulses bilaterally.  NEUROLOGIC: The patient is alert, awake, and oriented x3 with no focal motor or sensory deficits appreciated bilaterally.  SKIN: Moist and warm with no rashes appreciated.  Psych: Not anxious, depressed LN: No inguinal LN enlargement    Antibiotics   Anti-infectives    None      Medications   Scheduled Meds: . amLODipine  10 mg Oral Daily  . enoxaparin (LOVENOX) injection  40 mg Subcutaneous QHS  . labetalol  300 mg Oral TID  . sodium chloride flush  3 mL Intravenous Q12H   Continuous Infusions: . niCARDipine Stopped (02/09/16 1119)   PRN Meds:.acetaminophen **OR** acetaminophen, docusate sodium, ibuprofen, ondansetron **OR** ondansetron (ZOFRAN) IV, oxyCODONE   Data Review:   Micro Results Recent Results (from the past 240 hour(s))  MRSA PCR Screening     Status: None   Collection Time: 02/08/16 11:15 PM  Result Value Ref Range Status   MRSA by PCR NEGATIVE NEGATIVE Final    Comment:        The GeneXpert MRSA Assay (FDA approved for NASAL specimens only), is one component of  a comprehensive MRSA colonization surveillance program. It is not intended to diagnose MRSA infection nor to guide or monitor treatment for MRSA infections.     Radiology Reports No results found.   CBC  Recent Labs Lab 02/08/16 1636 02/09/16 0612  WBC 6.1 5.7  HGB 11.9* 12.1  HCT 35.8 36.4  PLT 178 185  MCV 77.8* 77.3*  MCH 25.9* 25.7*  MCHC 33.3 33.3  RDW 20.1* 20.0*  LYMPHSABS 1.2  --   MONOABS 0.4  --   EOSABS 0.2  --   BASOSABS 0.1  --     Chemistries   Recent Labs Lab 02/08/16 1636 02/09/16 0612  NA 144 143  K 3.8 3.2*  CL 112* 106  CO2 26 27  GLUCOSE 88 82  BUN 13 8  CREATININE 0.88 0.73  CALCIUM 8.5* 8.4*  MG 1.4* 1.7  AST 17  --   ALT 11*  --   ALKPHOS 75  --   BILITOT <0.1*  --    ------------------------------------------------------------------------------------------------------------------ estimated creatinine clearance is 107.7 mL/min (by C-G formula based on SCr of 0.73 mg/dL). ------------------------------------------------------------------------------------------------------------------ No results for input(s): HGBA1C in the last 72 hours. ------------------------------------------------------------------------------------------------------------------ No results for input(s): CHOL, HDL, LDLCALC, TRIG, CHOLHDL, LDLDIRECT in the last 72 hours. ------------------------------------------------------------------------------------------------------------------ No results for input(s): TSH, T4TOTAL, T3FREE, THYROIDAB in the last 72 hours.  Invalid input(s): FREET3 ------------------------------------------------------------------------------------------------------------------ No results for input(s): VITAMINB12, FOLATE, FERRITIN, TIBC, IRON, RETICCTPCT in the last 72 hours.  Coagulation profile  Recent Labs Lab 02/08/16 1636  INR 1.03    No results for input(s): DDIMER in the last 72 hours.  Cardiac Enzymes  Recent Labs Lab  02/08/16 1636  TROPONINI <0.03   ------------------------------------------------------------------------------------------------------------------ Invalid input(s): POCBNP    Assessment & Plan  Patient is a 25 year old with recent delivery of twins now with accelerated hypertension and headache  1. Accelerated hypertension blood pressures improved I will place her on Norvasc and labetalol  2.  Headache possible migraine I have discussed the case with neurology they recommended MRI and MRV of the brain       Code Status Orders        Start     Ordered   02/08/16 2316  Full code  Continuous     02/08/16 2315    Code Status History    Date Active Date Inactive Code Status Order ID Comments User Context   This patient has a current code status but no historical code status.           Consults  neurology  DVT Prophylaxis  scd's  Lab Results  Component Value Date   PLT 185 02/09/2016     Time Spent in minutes   35min Greater than 50% of time spent in care coordination and counseling patient regarding the condition and plan of care.   Auburn BilberryPATEL, Chaske Paskett M.D on 02/09/2016 at 3:48 PM  Between 7am to 6pm - Pager - [phone]  After 6pm go to [email or web address] - password [email or web address] [facility]  [facility]   Office  [phone]

## 2016-02-09 NOTE — Progress Notes (Signed)
Patient transferred to Room 336 via wheelchair. Report given to [place], RN no further questions. Patient transferred without complications.

## 2016-02-10 ENCOUNTER — Inpatient Hospital Stay: Payer: Self-pay

## 2016-02-10 DIAGNOSIS — I161 Hypertensive emergency: Secondary | ICD-10-CM

## 2016-02-10 LAB — BASIC METABOLIC PANEL
Anion gap: 7 (ref 5–15)
BUN: 10 mg/dL (ref 6–20)
CO2: 28 mmol/L (ref 22–32)
Calcium: 8.7 mg/dL — ABNORMAL LOW (ref 8.9–10.3)
Chloride: 106 mmol/L (ref 101–111)
Creatinine, Ser: 0.75 mg/dL (ref 0.44–1.00)
GFR calc Af Amer: >60 mL/min (ref >60)
GFR calc non Af Amer: >60 mL/min (ref >60)
Glucose, Bld: 108 mg/dL — ABNORMAL HIGH (ref 65–99)
Potassium: 3.8 mmol/L (ref 3.5–5.1)
Sodium: 141 mmol/L (ref 135–145)

## 2016-02-10 LAB — MAGNESIUM: Magnesium: 2 mg/dL (ref 1.7–2.4)

## 2016-02-10 LAB — URINE CULTURE: Special Requests: NORMAL

## 2016-02-10 MED ORDER — LABETALOL HCL 300 MG PO TABS: 300.0000 mg | ORAL_TABLET | Freq: Three times a day (TID) | ORAL | 0 refills | Status: DC

## 2016-02-10 MED ORDER — OXYCODONE-ACETAMINOPHEN 5-325 MG PO TABS: 1.0000 | ORAL_TABLET | Freq: Three times a day (TID) | ORAL | 0 refills | Status: DC | PRN

## 2016-02-10 NOTE — Discharge Summary (Signed)
Laurie BecketBrittany Terrio, 25 y.o., DOB [birth year], MRN [id number]. Admission date: 02/08/2016 Discharge Date 02/10/2016 Primary MD No PCP Per Patient Admitting Physician Oralia Manisavid Willis, MD  Admission Diagnosis  Toothache [K08.89] Hypertensive urgency [I16.0] Postoperative abdominal pain [R10.9, G89.18] Acute intractable headache, unspecified headache type [R51]  Discharge Diagnosis   Principal Problem:   Accelerated hypertension  Headache related to accelerated hypertension as well as possible bad tooth          Hospital Course  Laurie Petersen  is a 25 y.o. female who presents with 3 days of headaches. Patient is several weeks out from cesarean section delivery of her twins. On presentation the ED   she had significantly elevated blood pressure. She was given magnesium and IV labetalol which controlled her blood pressure and her headache improved. Patient was started on the cart appearing drip. Her blood pressure improved she was subsequently switched over to oral medications. Patient had a headache on presentation headache persisted even after blood pressure normalizing. Patient had MRI of the brain which was negative she also had a MRV which was negative. She was seen by neurology her headache was specific thought to be nonspecific. She does have some issues with her tooth so she will be seen by a dentist for that. But now feeling much better and wants to go home.            Consults  neurology  Significant Tests:  See full reports for all details     Mr Brain Wo Contrast  Result Date: 02/10/2016 CLINICAL DATA:  Recent childbirth.  Hypertension and headache. EXAM: MRI HEAD WITHOUT CONTRAST MRV HEAD WITHOUT CONTRAST TECHNIQUE: Multiplanar, multiecho pulse sequences of the brain and surrounding structures were obtained without intravenous contrast. Angiographic images of the head were obtained using MRV technique without contrast. COMPARISON:  None. FINDINGS: MRI HEAD FINDINGS Brain: The  brain has normal appearance without evidence of malformation, atrophy, old or acute small or large vessel infarction, hemorrhage, hydrocephalus or extra-axial collection. No pituitary abnormality. Vascular: Major vessels at the base of the brain show flow. Skull and upper cervical spine: Normal Sinuses/Orbits: Clear/ normal. Other: None significant. Small amount of benign fat present within the substance of the right parotid gland. MRV HEAD FINDINGS Superior sagittal sinus is widely patent. Deep venous system is widely patent. Transverse sinuses and jugular veins are widely patent. No missing superficial veins identified. IMPRESSION: Normal MRI of the brain. Normal MR venography. Electronically Signed   By: Paulina FusiMark  Shogry M.D.   On: 02/10/2016 10:17   Mr Mrv Head Wo Cm  Result Date: 02/10/2016 CLINICAL DATA:  Recent childbirth.  Hypertension and headache. EXAM: MRI HEAD WITHOUT CONTRAST MRV HEAD WITHOUT CONTRAST TECHNIQUE: Multiplanar, multiecho pulse sequences of the brain and surrounding structures were obtained without intravenous contrast. Angiographic images of the head were obtained using MRV technique without contrast. COMPARISON:  None. FINDINGS: MRI HEAD FINDINGS Brain: The brain has normal appearance without evidence of malformation, atrophy, old or acute small or large vessel infarction, hemorrhage, hydrocephalus or extra-axial collection. No pituitary abnormality. Vascular: Major vessels at the base of the brain show flow. Skull and upper cervical spine: Normal Sinuses/Orbits: Clear/ normal. Other: None significant. Small amount of benign fat present within the substance of the right parotid gland. MRV HEAD FINDINGS Superior sagittal sinus is widely patent. Deep venous system is widely patent. Transverse sinuses and jugular veins are widely patent. No missing superficial veins identified. IMPRESSION: Normal MRI of the brain. Normal MR venography. Electronically Signed  By: Paulina FusiMark  Shogry M.D.   On:  02/10/2016 10:17       Today   Subjective:   Laurie BecketBrittany Holmer  patient feels better wants to go home  Objective:   Blood pressure 124/78, pulse 78, temperature 98.4 F (36.9 C), temperature source Oral, resp. rate 16, height 5\' 6"  (1.676 m), weight 160 lb 0.9 oz (72.6 kg), SpO2 99 %.  .  Intake/Output Summary (Last 24 hours) at 02/10/16 1344 Last data filed at 02/10/16 0900  Gross per 24 hour  Intake                0 ml  Output                0 ml  Net                0 ml    Exam VITAL SIGNS: Blood pressure 124/78, pulse 78, temperature 98.4 F (36.9 C), temperature source Oral, resp. rate 16, height 5\' 6"  (1.676 m), weight 160 lb 0.9 oz (72.6 kg), SpO2 99 %.  GENERAL:  25 y.o.-year-old patient lying in the bed with no acute distress.  EYES: Pupils equal, round, reactive to light and accommodation. No scleral icterus. Extraocular muscles intact.  HEENT: Head atraumatic, normocephalic. Oropharynx and nasopharynx clear.  NECK:  Supple, no jugular venous distention. No thyroid enlargement, no tenderness.  LUNGS: Normal breath sounds bilaterally, no wheezing, rales,rhonchi or crepitation. No use of accessory muscles of respiration.  CARDIOVASCULAR: S1, S2 normal. No murmurs, rubs, or gallops.  ABDOMEN: Soft, nontender, nondistended. Bowel sounds present. No organomegaly or mass.  EXTREMITIES: No pedal edema, cyanosis, or clubbing.  NEUROLOGIC: Cranial nerves II through XII are intact. Muscle strength 5/5 in all extremities. Sensation intact. Gait not checked.  PSYCHIATRIC: The patient is alert and oriented x 3.  SKIN: No obvious rash, lesion, or ulcer.   Data Review     CBC w Diff: Lab Results  Component Value Date   WBC 5.7 02/09/2016   HGB 12.1 02/09/2016   HGB 13.5 04/06/2011   HCT 36.4 02/09/2016   HCT 40.9 04/06/2011   PLT 185 02/09/2016   PLT 247 04/06/2011   LYMPHOPCT 20 02/08/2016   MONOPCT 6 02/08/2016   EOSPCT 4 02/08/2016   BASOPCT 2 02/08/2016   CMP:  Lab Results  Component Value Date   NA 141 02/10/2016   K 3.8 02/10/2016   CL 106 02/10/2016   CO2 28 02/10/2016   BUN 10 02/10/2016   CREATININE 0.75 02/10/2016   PROT 6.7 02/08/2016   ALBUMIN 3.1 (L) 02/08/2016   BILITOT <0.1 (L) 02/08/2016   ALKPHOS 75 02/08/2016   AST 17 02/08/2016   ALT 11 (L) 02/08/2016  .  Micro Results Recent Results (from the past 240 hour(s))  Urine culture     Status: Abnormal   Collection Time: 02/08/16  4:36 PM  Result Value Ref Range Status   Specimen Description URINE, CLEAN CATCH  Final   Special Requests Normal  Final   Culture MULTIPLE SPECIES PRESENT, SUGGEST RECOLLECTION (A)  Final   Report Status 02/10/2016 FINAL  Final  MRSA PCR Screening     Status: None   Collection Time: 02/08/16 11:15 PM  Result Value Ref Range Status   MRSA by PCR NEGATIVE NEGATIVE Final    Comment:        The GeneXpert MRSA Assay (FDA approved for NASAL specimens only), is one component of a comprehensive MRSA colonization surveillance program. It  is not intended to diagnose MRSA infection nor to guide or monitor treatment for MRSA infections.         Code Status Orders        Start     Ordered   02/08/16 2316  Full code  Continuous     02/08/16 2315    Code Status History    Date Active Date Inactive Code Status Order ID Comments User Context   This patient has a current code status but no historical code status.          Follow-up Information    pcp Follow up in 1 week(s).           Discharge Medications     Medication List    TAKE these medications   acetaminophen 325 MG tablet Commonly known as:  TYLENOL Take 650 mg by mouth every 6 (six) hours.   docusate sodium 100 MG capsule Commonly known as:  COLACE Take 100 mg by mouth 2 (two) times daily as needed for moderate constipation.   ibuprofen 600 MG tablet Commonly known as:  ADVIL,MOTRIN Take 600 mg by mouth every 6 (six) hours. Notes to patient:  Received  Ibuprofen 400mg  today 02-10-16, today at 1044 am   labetalol 300 MG tablet Commonly known as:  NORMODYNE Take 1 tablet (300 mg total) by mouth 3 (three) times daily. Notes to patient:  Last dose today (02-10-2016) at 1042 am   oxyCODONE-acetaminophen 5-325 MG tablet Commonly known as:  ROXICET Take 1 tablet by mouth every 8 (eight) hours as needed for severe pain.          Total Time in preparing paper work, data evaluation and todays exam - 35 minutes  Auburn Bilberry M.D on 02/10/2016 at 1:44 PM  [facility]   Office  [phone]

## 2016-02-10 NOTE — Progress Notes (Signed)
Discharge Instructions reviewed with patient and significant other with patient permission. Patient Verbalized understanding. Patient given prescription for Oycodone-Acetaminophen, all questions answered. Patient made aware that she needs to follow up with PCP, and information given regarding hypertension. Breast milk taken with patient (as well as belongings) to nursery for labeling as patient pumped this morning and had additional milk for children who are in the nursery. Patient left the unit ambulatory stating that she was going to visit with children in nursery.

## 2016-02-10 NOTE — Consult Note (Signed)
Reason for Consult:Headache Referring Physician: Allena KatzPatel  CC: Headache  HPI: Laurie Petersen is an 25 y.o. female who is two weeks post partum with twins who presented with a three day history of headache.  BP was elevated as well.  No evidence of eclampsia.  Required Cardene with improvement of headache but no resolution.  Consult called for further evaluation.  Patient describes headache as on the left (temporal) where she has a bad tooth with bad tooth pain as well.  Current headache throbbing but not severe at 2/10.    Past Medical History:  Diagnosis Date  . Eclampsia     Past Surgical History:  Procedure Laterality Date  . CESAREAN SECTION    . TONSILLECTOMY      Family History  Problem Relation Age of Onset  . Arthritis Maternal Grandmother   . Diabetes Maternal Grandmother     Social History:  reports that she has quit smoking. Her smoking use included Cigarettes. She smoked 0.50 packs per day. She has never used smokeless tobacco. She reports that she does not drink alcohol or use drugs.  No Known Allergies  Medications:  I have reviewed the patient's current medications. Prior to Admission:  No prescriptions prior to admission.   Scheduled: . enoxaparin (LOVENOX) injection  40 mg Subcutaneous QHS  . labetalol  300 mg Oral TID  . sodium chloride flush  3 mL Intravenous Q12H    ROS: History obtained from the patient  General ROS: negative for - chills, fatigue, fever, night sweats, weight gain or weight loss Psychological ROS: negative for - behavioral disorder, hallucinations, memory difficulties, mood swings or suicidal ideation Ophthalmic ROS: negative for - blurry vision, double vision, eye pain or loss of vision ENT ROS: as noted in HPI Allergy and Immunology ROS: negative for - hives or itchy/watery eyes Hematological and Lymphatic ROS: negative for - bleeding problems, bruising or swollen lymph nodes Endocrine ROS: negative for - galactorrhea, hair pattern  changes, polydipsia/polyuria or temperature intolerance Respiratory ROS: negative for - cough, hemoptysis, shortness of breath or wheezing Cardiovascular ROS: negative for - chest pain, dyspnea on exertion, edema or irregular heartbeat Gastrointestinal ROS: negative for - abdominal pain, diarrhea, hematemesis, nausea/vomiting or stool incontinence Genito-Urinary ROS: negative for - dysuria, hematuria, incontinence or urinary frequency/urgency Musculoskeletal ROS: negative for - joint swelling or muscular weakness Neurological ROS: as noted in HPI Dermatological ROS: negative for rash and skin lesion changes  Physical Examination: Blood pressure 124/78, pulse 78, temperature 98.4 F (36.9 C), temperature source Oral, resp. rate 16, height 5\' 6"  (1.676 m), weight 72.6 kg (160 lb 0.9 oz), SpO2 99 %.  HEENT-  Normocephalic, no lesions, without obvious abnormality.  Normal external eye and conjunctiva.  Normal TM's bilaterally.  Normal auditory canals and external ears. Normal external nose, mucus membranes and septum.  Normal pharynx. Cardiovascular- S1, S2 normal, pulses palpable throughout   Lungs- chest clear, no wheezing, rales, normal symmetric air entry Abdomen- soft, non-tender; bowel sounds normal; no masses,  no organomegaly Extremities- no edema Lymph-no adenopathy palpable Musculoskeletal-no joint tenderness, deformity or swelling Skin-warm and dry, no hyperpigmentation, vitiligo, or suspicious lesions  Neurological Examination Mental Status: Alert, oriented, thought content appropriate.  Speech fluent without evidence of aphasia.  Able to follow 3 step commands without difficulty. Cranial Nerves: II: Discs flat bilaterally; Visual fields grossly normal, pupils equal, round, reactive to light and accommodation III,IV, VI: ptosis not present, extra-ocular motions intact bilaterally V,VII: smile symmetric, facial light touch sensation normal bilaterally  VIII: hearing normal  bilaterally IX,X: gag reflex present XI: bilateral shoulder shrug XII: midline tongue extension Motor: Right : Upper extremity   5/5    Left:     Upper extremity   5/5  Lower extremity   5/5     Lower extremity   5/5 Tone and bulk:normal tone throughout; no atrophy noted Sensory: Pinprick and light touch intact throughout, bilaterally Deep Tendon Reflexes: 2+ and symmetric throughout Plantars: Right: downgoing   Left: downgoing Cerebellar: normal finger-to-nose, normal rapid alternating movements and normal heel-to-shin test Gait: normal gait and station    Laboratory Studies:   Basic Metabolic Panel:  Recent Labs Lab 02/08/16 1636 02/09/16 0612 02/10/16 0518  NA 144 143 141  K 3.8 3.2* 3.8  CL 112* 106 106  CO2 26 27 28   GLUCOSE 88 82 108*  BUN 13 8 10   CREATININE 0.88 0.73 0.75  CALCIUM 8.5* 8.4* 8.7*  MG 1.4* 1.7 2.0    Liver Function Tests:  Recent Labs Lab 02/08/16 1636  AST 17  ALT 11*  ALKPHOS 75  BILITOT <0.1*  PROT 6.7  ALBUMIN 3.1*   No results for input(s): LIPASE, AMYLASE in the last 168 hours. No results for input(s): AMMONIA in the last 168 hours.  CBC:  Recent Labs Lab 02/08/16 1636 02/09/16 0612  WBC 6.1 5.7  NEUTROABS 4.2  --   HGB 11.9* 12.1  HCT 35.8 36.4  MCV 77.8* 77.3*  PLT 178 185    Cardiac Enzymes:  Recent Labs Lab 02/08/16 1636  TROPONINI <0.03    BNP: Invalid input(s): POCBNP  CBG:  Recent Labs Lab 02/08/16 2318  GLUCAP 81    Microbiology: Results for orders placed or performed during the hospital encounter of 02/08/16  Urine culture     Status: Abnormal   Collection Time: 02/08/16  4:36 PM  Result Value Ref Range Status   Specimen Description URINE, CLEAN CATCH  Final   Special Requests Normal  Final   Culture MULTIPLE SPECIES PRESENT, SUGGEST RECOLLECTION (A)  Final   Report Status 02/10/2016 FINAL  Final  MRSA PCR Screening     Status: None   Collection Time: 02/08/16 11:15 PM  Result Value  Ref Range Status   MRSA by PCR NEGATIVE NEGATIVE Final    Comment:        The GeneXpert MRSA Assay (FDA approved for NASAL specimens only), is one component of a comprehensive MRSA colonization surveillance program. It is not intended to diagnose MRSA infection nor to guide or monitor treatment for MRSA infections.     Coagulation Studies:  Recent Labs  02/08/16 1636  LABPROT 13.5  INR 1.03    Urinalysis:  Recent Labs Lab 02/08/16 1636  COLORURINE STRAW*  LABSPEC 1.009  PHURINE 7.0  GLUCOSEU NEGATIVE  HGBUR 3+*  BILIRUBINUR NEGATIVE  KETONESUR NEGATIVE  PROTEINUR NEGATIVE  NITRITE NEGATIVE  LEUKOCYTESUR 2+*    Lipid Panel:  No results found for: CHOL, TRIG, HDL, CHOLHDL, VLDL, LDLCALC  HgbA1C: No results found for: HGBA1C  Urine Drug Screen:  No results found for: LABOPIA, COCAINSCRNUR, LABBENZ, AMPHETMU, THCU, LABBARB  Alcohol Level: No results for input(s): ETH in the last 168 hours.   Imaging: Mr Brain Wo Contrast  Result Date: 02/10/2016 CLINICAL DATA:  Recent childbirth.  Hypertension and headache. EXAM: MRI HEAD WITHOUT CONTRAST MRV HEAD WITHOUT CONTRAST TECHNIQUE: Multiplanar, multiecho pulse sequences of the brain and surrounding structures were obtained without intravenous contrast. Angiographic images of the head were obtained  using MRV technique without contrast. COMPARISON:  None. FINDINGS: MRI HEAD FINDINGS Brain: The brain has normal appearance without evidence of malformation, atrophy, old or acute small or large vessel infarction, hemorrhage, hydrocephalus or extra-axial collection. No pituitary abnormality. Vascular: Major vessels at the base of the brain show flow. Skull and upper cervical spine: Normal Sinuses/Orbits: Clear/ normal. Other: None significant. Small amount of benign fat present within the substance of the right parotid gland. MRV HEAD FINDINGS Superior sagittal sinus is widely patent. Deep venous system is widely patent. Transverse  sinuses and jugular veins are widely patent. No missing superficial veins identified. IMPRESSION: Normal MRI of the brain. Normal MR venography. Electronically Signed   By: Paulina Fusi M.D.   On: 02/10/2016 10:17   Mr Mrv Head Wo Cm  Result Date: 02/10/2016 CLINICAL DATA:  Recent childbirth.  Hypertension and headache. EXAM: MRI HEAD WITHOUT CONTRAST MRV HEAD WITHOUT CONTRAST TECHNIQUE: Multiplanar, multiecho pulse sequences of the brain and surrounding structures were obtained without intravenous contrast. Angiographic images of the head were obtained using MRV technique without contrast. COMPARISON:  None. FINDINGS: MRI HEAD FINDINGS Brain: The brain has normal appearance without evidence of malformation, atrophy, old or acute small or large vessel infarction, hemorrhage, hydrocephalus or extra-axial collection. No pituitary abnormality. Vascular: Major vessels at the base of the brain show flow. Skull and upper cervical spine: Normal Sinuses/Orbits: Clear/ normal. Other: None significant. Small amount of benign fat present within the substance of the right parotid gland. MRV HEAD FINDINGS Superior sagittal sinus is widely patent. Deep venous system is widely patent. Transverse sinuses and jugular veins are widely patent. No missing superficial veins identified. IMPRESSION: Normal MRI of the brain. Normal MR venography. Electronically Signed   By: Paulina Fusi M.D.   On: 02/10/2016 10:17     Assessment/Plan: 25 year old female two weeks post partum with onset of headache.  Neurological examination is unremarkable.  MRI of the brain reviewed as well as MRV and show no abnormalities.  BP controlled.  No current evidence of eclampsia.  Suspect headache related to tooth.    Recommendations: 1.  No further neurologic intervention is recommended at this time.  PRN analgesia for headache.  If further questions arise, please call or page at that time.  Thank you for allowing neurology to participate in the  care of this patient.  Case discussed with Dr. Cora Collum, MD Neurology [phone] 02/10/2016, 1:13 PM

## 2016-02-10 NOTE — Discharge Instructions (Addendum)

## 2016-09-20 ENCOUNTER — Emergency Department: Payer: Medicaid Other

## 2016-09-20 ENCOUNTER — Emergency Department
Admission: EM | Admit: 2016-09-20 | Discharge: 2016-09-20 | Disposition: A | Discharge status: Home / Self Care | Payer: Medicaid Other | Attending: Emergency Medicine | Admitting: Emergency Medicine

## 2016-09-20 ENCOUNTER — Encounter: Payer: Self-pay | Admitting: *Deleted

## 2016-09-20 DIAGNOSIS — K0889 Other specified disorders of teeth and supporting structures: Secondary | ICD-10-CM

## 2016-09-20 DIAGNOSIS — O26859 Spotting complicating pregnancy, unspecified trimester: Secondary | ICD-10-CM | POA: Insufficient documentation

## 2016-09-20 DIAGNOSIS — O4690 Antepartum hemorrhage, unspecified, unspecified trimester: Secondary | ICD-10-CM

## 2016-09-20 DIAGNOSIS — Z3A01 Less than 8 weeks gestation of pregnancy: Secondary | ICD-10-CM | POA: Insufficient documentation

## 2016-09-20 LAB — CBC WITH DIFFERENTIAL/PLATELET
Basophils Absolute: 0 10*3/uL (ref 0–0.1)
Basophils Relative: 1 %
Eosinophils Absolute: 0.1 10*3/uL (ref 0–0.7)
Eosinophils Relative: 1 %
HCT: 35.3 % (ref 35.0–47.0)
Hemoglobin: 12 g/dL (ref 12.0–16.0)
Lymphocytes Relative: 23 %
Lymphs Abs: 2.1 10*3/uL (ref 1.0–3.6)
MCH: 26.2 pg (ref 26.0–34.0)
MCHC: 34 g/dL (ref 32.0–36.0)
MCV: 77.2 fL — ABNORMAL LOW (ref 80.0–100.0)
Monocytes Absolute: 0.5 10*3/uL (ref 0.2–0.9)
Monocytes Relative: 5 %
Neutro Abs: 6.2 10*3/uL (ref 1.4–6.5)
Neutrophils Relative %: 70 %
Platelets: 227 10*3/uL (ref 150–440)
RBC: 4.57 MIL/uL (ref 3.80–5.20)
RDW: 15 % — ABNORMAL HIGH (ref 11.5–14.5)
WBC: 8.9 10*3/uL (ref 3.6–11.0)

## 2016-09-20 LAB — URINALYSIS, COMPLETE (UACMP) WITH MICROSCOPIC
Bacteria, UA: NONE SEEN
Glucose, UA: NEGATIVE mg/dL
Hgb urine dipstick: NEGATIVE
Leukocytes, UA: NEGATIVE
Nitrite: NEGATIVE
Specific Gravity, Urine: 1.025 (ref 1.005–1.030)
pH: 6.5 (ref 5.0–8.0)

## 2016-09-20 LAB — BASIC METABOLIC PANEL
Anion gap: 6 (ref 5–15)
BUN: 11 mg/dL (ref 6–20)
CO2: 26 mmol/L (ref 22–32)
Calcium: 9.3 mg/dL (ref 8.9–10.3)
Chloride: 102 mmol/L (ref 101–111)
Creatinine, Ser: 0.54 mg/dL (ref 0.44–1.00)
GFR calc Af Amer: >60 mL/min (ref >60)
GFR calc non Af Amer: >60 mL/min (ref >60)
Glucose, Bld: 104 mg/dL — ABNORMAL HIGH (ref 65–99)
Potassium: 4.1 mmol/L (ref 3.5–5.1)
Sodium: 134 mmol/L — ABNORMAL LOW (ref 135–145)

## 2016-09-20 LAB — ABO/RH: ABO/RH(D): O POS

## 2016-09-20 LAB — POCT PREGNANCY, URINE: Preg Test, Ur: POSITIVE — AB

## 2016-09-20 LAB — HCG, QUANTITATIVE, PREGNANCY: hCG, Beta Chain, Quant, S: 86575 m[IU]/mL — ABNORMAL HIGH (ref <5)

## 2016-09-20 MED ORDER — PENICILLIN V POTASSIUM 500 MG PO TABS: 500.0000 mg | ORAL_TABLET | Freq: Three times a day (TID) | ORAL | 0 refills | Status: DC

## 2016-09-20 NOTE — ED Provider Notes (Signed)
[facility] Emergency Department Provider Note  ____________________________________________   I have reviewed the triage vital signs and the nursing notes.   HISTORY  Chief Complaint Dental Pain and Vaginal Bleeding    HPI Laurie Petersen is a 26 y.o. female who presents today complaining of spotting. Patient is G3, with 1 single birth 6 years ago and identical twins last October, did have high blood pressure at the end of her last pregnancy with twins although she states she is not sure who was frank preeclampsia. In any event, patient had her last missed her period of April 12 which makes her a little over [redacted] weeks pregnant. Did have positive for an C test at home. Has had slight spotting over the last week. Much less than a menstrual period. Patient states she has slight cramping over the last couple weeks as well. She has had no passage of tissue or significant lateralizing signs of pain. She denies fever or vomiting. She declines pelvic exam but would like an ultrasound to make sure that nothing wrong with the pregnancy. She says limitations of not having a pelvic exam. Patient also states that she has a molar that is decayed and she is worried it might get infected and she is requesting antibiotics without pain medication at this time     Past Medical History:  Diagnosis Date  . Eclampsia     Patient Active Problem List   Diagnosis Date Noted  . Hypertensive emergency 02/09/2016  . Accelerated hypertension 02/08/2016    Past Surgical History:  Procedure Laterality Date  . CESAREAN SECTION    . TONSILLECTOMY      Prior to Admission medications   Medication Sig Start Date End Date Taking? Authorizing Provider  acetaminophen (TYLENOL) 325 MG tablet Take 650 mg by mouth every 6 (six) hours. 01/28/16   [provider]  ibuprofen (ADVIL,MOTRIN) 600 MG tablet Take 600 mg by mouth every 6 (six) hours. 01/28/16   [provider]   labetalol (NORMODYNE) 300 MG tablet Take 1 tablet (300 mg total) by mouth 3 (three) times daily. 02/10/16   Auburn Bilberry, MD  oxyCODONE-acetaminophen (ROXICET) 5-325 MG tablet Take 1 tablet by mouth every 8 (eight) hours as needed for severe pain. 02/10/16   Auburn Bilberry, MD    Allergies Patient has no known allergies.  Family History  Problem Relation Age of Onset  . Arthritis Maternal Grandmother   . Diabetes Maternal Grandmother     Social History Social History  Substance Use Topics  . Smoking status: Former Smoker    Packs/day: 0.50    Types: Cigarettes  . Smokeless tobacco: Never Used  . Alcohol use No    Review of Systems Constitutional: No fever/chills Eyes: No visual changes. ENT: No sore throat. No stiff neck no neck pain Cardiovascular: Denies chest pain. Respiratory: Denies shortness of breath. Gastrointestinal:   no vomiting.  No diarrhea.  No constipation. Genitourinary: Negative for dysuria. Musculoskeletal: Negative lower extremity swelling Skin: Negative for rash. Neurological: Negative for severe headaches, focal weakness or numbness.   ____________________________________________   PHYSICAL EXAM:  VITAL SIGNS: ED Triage Vitals  Enc Vitals Group     BP 09/20/16 1642 130/84     Pulse Rate 09/20/16 1642 98     Resp 09/20/16 1642 18     Temp 09/20/16 1642 97.7 F (36.5 C)     Temp Source 09/20/16 1642 Oral     SpO2 09/20/16 1642 100 %  Weight 09/20/16 1643 140 lb (63.5 kg)     Height 09/20/16 1643 5\' 7"  (1.702 m)     Head Circumference --      Peak Flow --      Pain Score 09/20/16 1641 7     Pain Loc --      Pain Edu? --      Excl. in GC? --     Constitutional: Alert and oriented. Well appearing and in no acute distress.Laughing and joking with her partner Eyes: Conjunctivae are normal Head: Atraumatic HEENT: No congestion/rhinnorhea. Mucous membranes are moist.  Oropharynx non-erythematous, there is significant decay noted to  the back left lower molar. There is no surrounding erythema it is tender to touch no access Neck:   Nontender with no meningismus, no masses, no stridor Cardiovascular: Normal rate, regular rhythm. Grossly normal heart sounds.  Good peripheral circulation. Respiratory: Normal respiratory effort.  No retractions. Lungs CTAB. Abdominal: Soft and nontender. No distention. No guarding no rebound Back:  There is no focal tenderness or step off.  there is no midline tenderness there are no lesions noted. there is no CVA tenderness GU: Patient declines Musculoskeletal: No lower extremity tenderness, no upper extremity tenderness. No joint effusions, no DVT signs strong distal pulses no edema Neurologic:  Normal speech and language. No gross focal neurologic deficits are appreciated.  Skin:  Skin is warm, dry and intact. No rash noted. Psychiatric: Mood and affect are normal. Speech and behavior are normal.  ____________________________________________   LABS (all labs ordered are listed, but only abnormal results are displayed)  Labs Reviewed  CBC WITH DIFFERENTIAL/PLATELET - Abnormal; Notable for the following:       Result Value   MCV 77.2 (*)    RDW 15.0 (*)    All other components within normal limits  BASIC METABOLIC PANEL - Abnormal; Notable for the following:    Sodium 134 (*)    Glucose, Bld 104 (*)    All other components within normal limits  URINALYSIS, COMPLETE (UACMP) WITH MICROSCOPIC - Abnormal; Notable for the following:    APPearance HAZY (*)    Bilirubin Urine SMALL (*)    Ketones, ur TRACE (*)    Protein, ur TRACE (*)    Squamous Epithelial / LPF 6-30 (*)    All other components within normal limits  POCT PREGNANCY, URINE - Abnormal; Notable for the following:    Preg Test, Ur POSITIVE (*)    All other components within normal limits  HCG, QUANTITATIVE, PREGNANCY  POC URINE PREG, ED  ABO/RH   ____________________________________________  EKG  I personally  interpreted any EKGs ordered by me or triage  ____________________________________________  RADIOLOGY  I reviewed any imaging ordered by me or triage that were performed during my shift and, if possible, patient and/or family made aware of any abnormal findings. ____________________________________________   PROCEDURES  Procedure(s) performed: None  Procedures  Critical Care performed: None  ____________________________________________   INITIAL IMPRESSION / ASSESSMENT AND PLAN / ED COURSE  Pertinent labs & imaging results that were available during my care of the patient were reviewed by me and considered in my medical decision making (see chart for details).  Very well-appearing woman here with vaginal spotting in early pregnancy. She is Rh+ by [facility] notes, we will recheck that to verify however likely will not need Rogaine. We will check the usual blood work including clots and we'll obtain an ultrasound. Patient declines pelvic exam, this time, she understands the limits that  displaces upon entrance of diagnosing other pathology. She also has some dental issues, no evidence of a significant infection but the tooth is very decayed and mildly tender we will give her penicillin VK pending outpatient dental referral which she understands she must have.    ____________________________________________   FINAL CLINICAL IMPRESSION(S) / ED DIAGNOSES  Final diagnoses:  None      This chart was dictated using voice recognition software.  Despite best efforts to proofread,  errors can occur which can change meaning.      Jeanmarie PlantMcShane, Gabrielle Wakeland A, MD 09/20/16 [phone]

## 2016-09-20 NOTE — ED Triage Notes (Signed)
Pt has left upper toothache for 1 week.  Pt also has vaginal spotting.  Pt had positive home pregnancy test recently.  No urinary sx. Pt alert.

## 2016-09-20 NOTE — ED Notes (Signed)
Pt states she had a missed period in may and then a positive pregnancy test, states some vaginal spotting and lower abd cramping, also states dental pain, pt awake and alert in no acute distress, family at bedside

## 2016-09-20 NOTE — ED Notes (Signed)
poct pregnancy Positive 

## 2016-09-20 NOTE — Discharge Instructions (Signed)
OPTIONS FOR DENTAL FOLLOW UP CARE ° °[place] Department of Health and Human Services - Local Safety Net Dental Clinics °http://www.ncdhhs.gov/dph/oralhealth/services/safetynetclinics.htm °  °[facility] (336-562-3123) ° °[facility] [place] (919-933-9087) ° °[facility] (919-663-1744 ext [phone]) ° °[facility] ([phone]) ° °[facility] ([phone]) °This clinic caters to the indigent population and is on a lottery system. °Location: °[facility], [address], [place] °Clinic Hours: °Wednesdays from 6pm - 9pm, patients seen by a lottery system. °For dates, call or go to www.med.[facility].edu/[facility]/patients/Dental-[facility] °Services: °Cleanings, fillings and simple extractions. °Payment Options: °DENTAL WORK IS FREE OF CHARGE. Bring proof of income or support. °Best way to get seen: °Arrive at 5:15 pm - this is a lottery, NOT first come/first serve, so arriving earlier will not increase your chances of being seen. °  °  °[facility] °919-537-3737 °Select option 1 for emergencies °  °Location: °[facility], [address], [place] °Clinic Hours: °No walk-ins accepted - call the day before to schedule an appointment. °Check in times are 9:30 am and 1:30 pm. °Services: °Simple extractions, temporary fillings, pulpectomy/pulp debridement, uncomplicated abscess drainage. °Payment Options: °PAYMENT IS DUE AT THE TIME OF SERVICE.  Fee is usually $100-200, additional surgical procedures (e.g. abscess drainage) may be extra. °Cash, checks, Visa/MasterCard accepted.  Can file Medicaid if patient is covered for dental - patient should call case worker to check. °No discount for [facility] patients. °Best way to get seen: °MUST call the day before and get onto the schedule. Can usually be seen the next 1-2 days. No walk-ins accepted. °  °  °[facility] °[phone] °   °Location: °[facility], [address], [place] °Clinic Hours: °M, W, Th, F 8am or 1:30pm, Tues 9a or 1:30 - first come/first served. °Services: °Simple extractions, temporary fillings, uncomplicated abscess drainage.  You do not need to be an [place] resident. °Payment Options: °PAYMENT IS DUE AT THE TIME OF SERVICE. °Dental insurance, otherwise sliding scale - bring proof of income or support. °Depending on income and treatment needed, cost is usually $50-200. °Best way to get seen: °Arrive early as it is first come/first served. °  °  °[facility] °[phone] °  °Location: °[address] °Clinic Hours: °Mon-Thu 8a-5p °Services: °Most basic dental services including extractions and fillings. °Payment Options: °PAYMENT IS DUE AT THE TIME OF SERVICE. °Sliding scale, up to 50% off - bring proof if income or support. °Medicaid with dental option accepted. °Best way to get seen: °Call to schedule an appointment, can usually be seen within 2 weeks OR they will try to see walk-ins - show up at 8a or 2p (you may have to wait). °  °  °[facility] °919-245-2435 °[place] RESIDENTS ONLY °  °Location: °[facility], [address], [place], [place] [address] °Clinic Hours: By appointment only. °Monday - Thursday 8am-5pm, Friday 8am-12pm °Services: Cleanings, fillings, extractions. °Payment Options: °PAYMENT IS DUE AT THE TIME OF SERVICE. °Cash, Visa or MasterCard. Sliding scale - $30 minimum per service. °Best way to get seen: °Come in to office, complete packet and make an appointment - need proof of income °or support monies for each household member and proof of [place] residence. °Usually takes about a month to get in. °  °  °[facility] °[phone] °  °Location: °[address]   [place] °Clinic Hours: Walk-in Urgent Care Dental Services are offered Monday-Friday  mornings only. °The numbers of emergencies accepted daily is limited to the number of °providers available. °Maximum 15 - Mondays, Wednesdays & Thursdays °Maximum 10 - Tuesdays & Fridays °Services: °You do not need to be a [place] resident to be seen for a dental emergency. °Emergencies are defined as pain, swelling, abnormal bleeding, or dental trauma. Walkins will receive x-rays if needed. °NOTE: Dental cleaning is not an emergency. °Payment Options: °PAYMENT IS DUE AT THE TIME OF SERVICE. °Minimum co-pay is $40.00 for uninsured patients. °Minimum co-pay is $3.00 for Medicaid with dental coverage. °Dental Insurance is accepted and must be presented at time of visit. °Medicare does not cover dental. °Forms of payment: Cash, credit card, checks. °Best way to get seen: °If not previously registered with the clinic, walk-in dental registration begins at 7:15 am and is on a first come/first serve basis. °If previously registered with the clinic, call to make an appointment. °  °  °[facility] °919-776-4359 °[place] RESIDENTS ONLY °  °Location: °[address], [place], [place] °Clinic Hours: °Mon-Thu 10a-2p °Services: Extractions only! °Payment Options: °FREE (donations accepted) - bring proof of income or support °Best way to get seen: °Call and schedule an appointment OR come at 8am on the 1st Monday of every month (except for holidays) when it is first come/first served. °  °  °[facility] °[phone] °  °Location: °[address], [place] °Clinic Hours: °Friday mornings °Services, Payment Options, Best way to get seen: °Call for info °

## 2016-10-25 ENCOUNTER — Ambulatory Visit (INDEPENDENT_AMBULATORY_CARE_PROVIDER_SITE_OTHER): Payer: Medicaid Other | Admitting: Obstetrics and Gynecology

## 2016-10-25 ENCOUNTER — Encounter: Payer: Self-pay | Admitting: Obstetrics and Gynecology

## 2016-10-25 ENCOUNTER — Other Ambulatory Visit: Payer: Self-pay | Admitting: Obstetrics and Gynecology

## 2016-10-25 VITALS — BP 115/66 | HR 67 | Ht 67.0 in | Wt 154.4 lb

## 2016-10-25 DIAGNOSIS — O34219 Maternal care for unspecified type scar from previous cesarean delivery: Secondary | ICD-10-CM | POA: Diagnosis not present

## 2016-10-25 DIAGNOSIS — Z3482 Encounter for supervision of other normal pregnancy, second trimester: Secondary | ICD-10-CM

## 2016-10-25 DIAGNOSIS — O0932 Supervision of pregnancy with insufficient antenatal care, second trimester: Secondary | ICD-10-CM

## 2016-10-25 DIAGNOSIS — O09892 Supervision of other high risk pregnancies, second trimester: Secondary | ICD-10-CM

## 2016-10-25 NOTE — Progress Notes (Signed)
HPI:      [name] is a 26 y.o. G3P2003 who LMP was Patient's last menstrual period was 07/13/2016 (exact date).  Subjective:   She presents today For follow-up after emergency department visit. She was found to be pregnant and was having bleeding. An ultrasound revealed a viable intrauterine pregnancy with a subchorionic hemorrhage. She continues to complain of daily spotting. She is currently 16 weeks estimated gestational age. This was an unplanned pregnancy. She is not taking prenatal vitamins. Her previous obstetric history includes a vaginal birth at term and a C-section for monoamniotic twins. After her twins she had some postpartum hypertension but this has resolved. She is considering permanent sterilization.    Hx: The following portions of the patient's history were reviewed and updated as appropriate:             She  has a past medical history of Eclampsia. She  does not have any pertinent problems on file. She  has a past surgical history that includes Tonsillectomy and Cesarean section. Her family history includes Arthritis in her maternal grandmother; Diabetes in her maternal grandmother. She  reports that she has quit smoking. Her smoking use included Cigarettes. She smoked 0.50 packs per day. She has never used smokeless tobacco. She reports that she does not drink alcohol or use drugs. She has No Known Allergies.       Review of Systems:  Review of Systems  Constitutional: Denied constitutional symptoms, night sweats, recent illness, fatigue, fever, insomnia and weight loss.  Eyes: Denied eye symptoms, eye pain, photophobia, vision change and visual disturbance.  Ears/Nose/Throat/Neck: Denied ear, nose, throat or neck symptoms, hearing loss, nasal discharge, sinus congestion and sore throat.  Cardiovascular: Denied cardiovascular symptoms, arrhythmia, chest pain/pressure, edema, exercise intolerance, orthopnea and palpitations.  Respiratory: Denied pulmonary  symptoms, asthma, pleuritic pain, productive sputum, cough, dyspnea and wheezing.  Gastrointestinal: Denied, gastro-esophageal reflux, melena, nausea and vomiting.  Genitourinary: Denied genitourinary symptoms including symptomatic vaginal discharge, pelvic relaxation issues, and urinary complaints.  Musculoskeletal: Denied musculoskeletal symptoms, stiffness, swelling, muscle weakness and myalgia.  Dermatologic: Denied dermatology symptoms, rash and scar.  Neurologic: Denied neurology symptoms, dizziness, headache, neck pain and syncope.  Psychiatric: Denied psychiatric symptoms, anxiety and depression.  Endocrine: Denied endocrine symptoms including hot flashes and night sweats.   Meds:   Current Outpatient Prescriptions on File Prior to Visit  Medication Sig Dispense Refill  . acetaminophen (TYLENOL) 325 MG tablet Take 650 mg by mouth every 6 (six) hours.    Marland Kitchen. ibuprofen (ADVIL,MOTRIN) 600 MG tablet Take 600 mg by mouth every 6 (six) hours.    Marland Kitchen. labetalol (NORMODYNE) 300 MG tablet Take 1 tablet (300 mg total) by mouth 3 (three) times daily. (Patient not taking: Reported on 10/25/2016) 90 tablet 0  . oxyCODONE-acetaminophen (ROXICET) 5-325 MG tablet Take 1 tablet by mouth every 8 (eight) hours as needed for severe pain. (Patient not taking: Reported on 10/25/2016) 24 tablet 0  . penicillin v potassium (VEETID) 500 MG tablet Take 1 tablet (500 mg total) by mouth 3 (three) times daily. (Patient not taking: Reported on 10/25/2016) 21 tablet 0   No current facility-administered medications on file prior to visit.     Objective:     Vitals:   10/25/16 1529  BP: 115/66  Pulse: 67              Fetal heart tones 148.  Assessment:    G3P2003 Patient Active Problem List   Diagnosis Date Noted  .  Hypertensive emergency 02/09/2016  . Accelerated hypertension 02/08/2016     1. Encounter for supervision of other normal pregnancy in second trimester   2. Insufficient prenatal care in second  trimester   3. Short interval between pregnancies affecting pregnancy in second trimester, antepartum   4. History of cesarean delivery, currently pregnant        Plan:            1.  Begin Prenatal vitamins   2.  Discussed VBAC in detail versus repeat cesarean delivery. Patient will consider both options and inform us in the future.   3. Patient desires quad screen today.   4.  Prenatal labs.   5.  Fetal anatomic survey and follow-up subchorionic hemorrhage ultrasound ordered.   6.  Needs new OB visit at next encounter.    Orders Orders Placed This Encounter  Procedures  . Urine Culture  . US OB Comp + 14 Wk  . ABO AND RH   . CBC With Differential  . Hepatitis B surface antigen  . HIV antibody  . Monitor Drug Profile 14(MW)  . Nicotine screen, urine  . RPR  . Rubella screen  . Sickle cell screen  . Urinalysis, Routine w reflex microscopic  . Varicella zoster antibody, IgG  . Antibody screen    No orders of the defined types were placed in this encounter.       F/U  Return in about 4 weeks (around 11/22/2016).  Elonda Huskyavid J. Ahmir Bracken, M.D. 10/25/2016 4:54 PM

## 2016-10-26 LAB — CBC WITH DIFFERENTIAL
Basophils Absolute: 0 x10E3/uL (ref 0.0–0.2)
Basos: 0 %
EOS (ABSOLUTE): 0.1 x10E3/uL (ref 0.0–0.4)
Eos: 1 %
Hematocrit: 34.5 % (ref 34.0–46.6)
Hemoglobin: 12.1 g/dL (ref 11.1–15.9)
Immature Grans (Abs): 0 x10E3/uL (ref 0.0–0.1)
Immature Granulocytes: 0 %
Lymphocytes Absolute: 2.1 x10E3/uL (ref 0.7–3.1)
Lymphs: 26 %
MCH: 26.3 pg — ABNORMAL LOW (ref 26.6–33.0)
MCHC: 35.1 g/dL (ref 31.5–35.7)
MCV: 75 fL — ABNORMAL LOW (ref 79–97)
Monocytes Absolute: 0.5 x10E3/uL (ref 0.1–0.9)
Monocytes: 6 %
Neutrophils Absolute: 5.2 x10E3/uL (ref 1.4–7.0)
Neutrophils: 67 %
RBC: 4.6 x10E6/uL (ref 3.77–5.28)
RDW: 16.2 % — ABNORMAL HIGH (ref 12.3–15.4)
WBC: 7.9 x10E3/uL (ref 3.4–10.8)

## 2016-10-26 LAB — VARICELLA ZOSTER ANTIBODY, IGG: Varicella zoster IgG: 663 index (ref >165)

## 2016-10-26 LAB — HEPATITIS B SURFACE ANTIGEN: Hepatitis B Surface Ag: NEGATIVE

## 2016-10-26 LAB — HIV ANTIBODY (ROUTINE TESTING W REFLEX): HIV Screen 4th Generation wRfx: NONREACTIVE

## 2016-10-26 LAB — SICKLE CELL SCREEN: Sickle Cell Screen: NEGATIVE

## 2016-10-26 LAB — RPR: RPR Ser Ql: NONREACTIVE

## 2016-10-26 LAB — ANTIBODY SCREEN: Antibody Screen: NEGATIVE

## 2016-10-26 LAB — RUBELLA SCREEN: Rubella Antibodies, IGG: 3.17 index (ref >0.99)

## 2016-10-26 LAB — ABO AND RH: Rh Factor: POSITIVE

## 2016-10-27 LAB — URINE CULTURE: Organism ID, Bacteria: NO GROWTH

## 2016-10-29 LAB — MONITOR DRUG PROFILE 14(MW)
Amphetamine Scrn, Ur: NEGATIVE ng/mL
BARBITURATE SCREEN URINE: NEGATIVE ng/mL
BENZODIAZEPINE SCREEN, URINE: NEGATIVE ng/mL
Buprenorphine, Urine: NEGATIVE ng/mL
Cocaine (Metab) Scrn, Ur: NEGATIVE ng/mL
Creatinine(Crt), U: 167.9 mg/dL (ref 20.0–300.0)
Fentanyl, Urine: NEGATIVE pg/mL
Meperidine Screen, Urine: NEGATIVE ng/mL
Methadone Screen, Urine: NEGATIVE ng/mL
OXYCODONE+OXYMORPHONE UR QL SCN: NEGATIVE ng/mL
Opiate Scrn, Ur: NEGATIVE ng/mL
Ph of Urine: 6.7 (ref 4.5–8.9)
Phencyclidine Qn, Ur: NEGATIVE ng/mL
Propoxyphene Scrn, Ur: NEGATIVE ng/mL
SPECIFIC GRAVITY: 1.027
Tramadol Screen, Urine: NEGATIVE ng/mL

## 2016-10-29 LAB — NICOTINE SCREEN, URINE: Cotinine Ql Scrn, Ur: POSITIVE ng/mL — AB

## 2016-10-29 LAB — URINALYSIS, ROUTINE W REFLEX MICROSCOPIC
Bilirubin, UA: NEGATIVE
Glucose, UA: NEGATIVE
Ketones, UA: NEGATIVE
Leukocytes, UA: NEGATIVE
Nitrite, UA: NEGATIVE
Protein, UA: NEGATIVE
RBC, UA: NEGATIVE
Specific Gravity, UA: 1.025 (ref 1.005–1.030)
Urobilinogen, Ur: 0.2 mg/dL (ref 0.2–1.0)
pH, UA: 6.5 (ref 5.0–7.5)

## 2016-10-29 LAB — CANNABINOID (GC/MS), URINE
Cannabinoid: POSITIVE — AB
Carboxy THC (GC/MS): >300 ng/mL

## 2016-10-31 LAB — AFP, SERUM, OPEN SPINA BIFIDA
AFP MoM: 0.88
AFP Value: 33.4 ng/mL
Gest. Age on Collection Date: 16.5 weeks
Maternal Age At EDD: 28.4 yr
OSBR Risk 1 IN: 10000
Test Results:: NEGATIVE
Weight: 154 [lb_av]

## 2016-11-22 ENCOUNTER — Ambulatory Visit (INDEPENDENT_AMBULATORY_CARE_PROVIDER_SITE_OTHER): Payer: Medicaid Other | Admitting: Obstetrics and Gynecology

## 2016-11-22 ENCOUNTER — Ambulatory Visit (INDEPENDENT_AMBULATORY_CARE_PROVIDER_SITE_OTHER): Payer: Medicaid Other

## 2016-11-22 ENCOUNTER — Encounter: Payer: Self-pay | Admitting: Obstetrics and Gynecology

## 2016-11-22 VITALS — BP 127/69 | HR 68 | Wt 159.1 lb

## 2016-11-22 DIAGNOSIS — Z3482 Encounter for supervision of other normal pregnancy, second trimester: Secondary | ICD-10-CM

## 2016-11-22 DIAGNOSIS — Z8759 Personal history of other complications of pregnancy, childbirth and the puerperium: Secondary | ICD-10-CM

## 2016-11-22 DIAGNOSIS — O09899 Supervision of other high risk pregnancies, unspecified trimester: Secondary | ICD-10-CM

## 2016-11-22 DIAGNOSIS — Z8679 Personal history of other diseases of the circulatory system: Secondary | ICD-10-CM

## 2016-11-22 DIAGNOSIS — K0889 Other specified disorders of teeth and supporting structures: Secondary | ICD-10-CM

## 2016-11-22 DIAGNOSIS — O0932 Supervision of pregnancy with insufficient antenatal care, second trimester: Secondary | ICD-10-CM

## 2016-11-22 DIAGNOSIS — O34219 Maternal care for unspecified type scar from previous cesarean delivery: Secondary | ICD-10-CM

## 2016-11-22 DIAGNOSIS — Z1379 Encounter for other screening for genetic and chromosomal anomalies: Secondary | ICD-10-CM

## 2016-11-22 LAB — POCT URINALYSIS DIPSTICK
Bilirubin, UA: NEGATIVE
Glucose, UA: NEGATIVE
Ketones, UA: NEGATIVE
Nitrite, UA: NEGATIVE
Protein, UA: NEGATIVE
Spec Grav, UA: 1.015 (ref 1.010–1.025)
Urobilinogen, UA: 0.2 E.U./dL
pH, UA: 8 (ref 5.0–8.0)

## 2016-11-22 MED ORDER — ACETAMINOPHEN 325 MG PO TABS: 650.0000 mg | ORAL_TABLET | Freq: Four times a day (QID) | ORAL | 2 refills | Status: DC

## 2016-11-22 MED ORDER — CITRANATAL BLOOM 90-1 MG PO TABS: 90.0000 mg | ORAL_TABLET | Freq: Every day | ORAL | 11 refills | Status: DC

## 2016-11-22 NOTE — Progress Notes (Signed)
OBSTETRIC INITIAL PRENATAL VISIT  Subjective:    Laurie Petersen is being seen today for her first obstetrical visit.  This is not a planned pregnancy. She is a [id number] female at [redacted]w[redacted]d gestation, Estimated Date of Delivery: 04/11/17 with Patient's last menstrual period was 07/13/2016 (approximate date), inconsistent with 11 week sono. Her obstetrical history is significant for history of prior C-section x 1 for twin gestation, history of postpartum HTN after 2nd pregnancy, recent tobacco cessation, late entry to prenatal care, and short intervals between pregnancies.  Relationship with FOB: significant other, living together. Patient does intend to breast feed. Pregnancy history fully reviewed.    Obstetric History   G3   P2   T1   P1   A0   L3    SAB0   TAB0   Ectopic0   Multiple1   Live Births3     # Outcome Date GA Lbr Len/2nd Weight Sex Delivery Anes PTL Lv  3 Current           2A Preterm 01/25/16 [redacted]w[redacted]d  3 lb 11 oz (1.673 kg) F CS-Unspec   LIV  2B Preterm  [redacted]w[redacted]d  4 lb (1.814 kg) F CS-Unspec   LIV  1 Term 11/10/10 [redacted]w[redacted]d  6 lb 14 oz (3.118 kg) M Vag-Forceps  N LIV    Obstetric Comments  G2 - mono-mono twins. Delivered at [facility]    Gynecologic History:  Last pap smear was 2017 (during last pregnancy).  Results were normal.  Denies h/o abnormal pap smears in the past.  Notes history of STIs: H/o trichomoniasis and Chlamydia.    Past Medical History:  Diagnosis Date  . Postpartum hypertension 01/2016    Family History  Problem Relation Age of Onset  . Arthritis Maternal Grandmother   . Diabetes Maternal Grandmother     Past Surgical History:  Procedure Laterality Date  . CESAREAN SECTION    . TONSILLECTOMY      Social History   Social History  . Marital status: Single    Spouse name: N/A  . Number of children: N/A  . Years of education: N/A   Occupational History  . Not on file.   Social History Main Topics  . Smoking status: Former Smoker    Packs/day: 0.50   Types: Cigarettes  . Smokeless tobacco: Never Used  . Alcohol use No  . Drug use: No  . Sexual activity: Yes    Birth control/ protection: None   Other Topics Concern  . Not on file   Social History Narrative  . No narrative on file     Current Outpatient Prescriptions on File Prior to Visit  Medication Sig Dispense Refill  . acetaminophen (TYLENOL) 325 MG tablet Take 650 mg by mouth every 6 (six) hours.    Marland Kitchen ibuprofen (ADVIL,MOTRIN) 600 MG tablet Take 600 mg by mouth every 6 (six) hours.     No current facility-administered medications on file prior to visit.      No Known Allergies   Review of Systems General:Not Present- Fever, Weight Loss and Weight Gain. Skin:Not Present- Rash. HEENT:Not Present- Blurred Vision, Headache and Bleeding Gums.  Positive for dental pain.  Respiratory:Not Present- Difficulty Breathing. Breast:Not Present- Breast Mass. Cardiovascular:Not Present- Chest Pain, Elevated Blood Pressure, Fainting / Blacking Out and Shortness of Breath. Gastrointestinal:Not Present- Abdominal Pain, Constipation, Nausea and Vomiting. Female Genitourinary:Not Present- Frequency, Painful Urination, Pelvic Pain, Vaginal Bleeding, Vaginal Discharge, Contractions, regular, Fetal Movements Decreased, Urinary Complaints and Vaginal Fluid. Musculoskeletal:Not Present-  Back Pain and Leg Cramps. Neurological:Not Present- Dizziness. Psychiatric:Not Present- Depression.     Objective:   Blood pressure 127/69, pulse 68, weight 159 lb 1.6 oz (72.2 kg), last menstrual period 07/13/2016.  Body mass index is 24.92 kg/m.   General Appearance:    Alert, cooperative, no distress, appears stated age  Head:    Normocephalic, without obvious abnormality, atraumatic  Eyes:    PERRL, conjunctiva/corneas clear, EOM's intact, both eyes  Ears:    Normal external ear canals, both ears  Nose:   Nares normal, septum midline, mucosa normal, no drainage or sinus tenderness    Throat:   Lips, mucosa, and tongue normal; teeth and gums normal  Neck:   Supple, symmetrical, trachea midline, no adenopathy; thyroid: no enlargement/tenderness/nodules; no carotid bruit or JVD  Back:     Symmetric, no curvature, ROM normal, no CVA tenderness  Lungs:     Clear to auscultation bilaterally, respirations unlabored  Chest Wall:    No tenderness or deformity   Heart:    Regular rate and rhythm, S1 and S2 normal, no murmur, rub or gallop  Breast Exam:    No tenderness, masses, or nipple abnormality  Abdomen:     Soft, non-tender, bowel sounds active all four quadrants, no masses, no organomegaly.  FH 20 cm.  FHT 145 bpm.  Pfannenstiel scar with keloid formation.   Genitalia:    Pelvic:external genitalia normal, vagina without lesions, discharge, or tenderness, rectovaginal septum  normal. Cervix normal in appearance, no cervical motion tenderness, no adnexal masses or tenderness.  Pregnancy positive findings: uterine enlargement: 20 wk size, nontender.   Rectal:    Normal external sphincter.  No hemorrhoids appreciated. Internal exam not done.   Extremities:   Extremities normal, atraumatic, no cyanosis or edema  Pulses:   2+ and symmetric all extremities  Skin:   Skin color, texture, turgor normal, no rashes or lesions  Lymph nodes:   Cervical, supraclavicular, and axillary nodes normal  Neurologic:   CNII-XII intact, normal strength, sensation and reflexes throughout      Assessment:    Pregnancy at 20 and 0/7 weeks   Late entry to prenatal care H/o cesarean section x 1 Short interval between pregnancies History of postpartum HTN  Plan:    Initial labs reviewed.  Prenatal vitamins encouraged.  Encouraged to use Tylenol only for pain relief during pregnancy.  Problem list reviewed and updated. New OB counseling:  The patient has been given an overview regarding routine prenatal care.  Recommendations regarding diet, weight gain, and exercise in pregnancy were  given. Prenatal testing, optional genetic testing, and ultrasound use in pregnancy were reviewed.  AFP4 and cell-free DNA discussed.  Patient desires cell-free DNA testing with MaterniT21.  Ordered.  Will also perform spina bifida screen.  Benefits of Breast Feeding were discussed. The patient is encouraged to consider nursing her baby post partum. Patient recently notes tobacco cessation.  Continued to encourage maintenance.  H/o C-section x 1 for twin delivery. Discussed VBAC vs C-section.  Desires repeat C-section (and also desires BTL at time of C-section).  Will sign papers at approximately [redacted] weeks gestation.  H/o postpartum HTN, will monitor for PIH during and after pregnancy as patient is at risk for recurrence.  Follow up in 4 weeks.  50% of 30 min visit spent on counseling and coordination of care.      Hildred Laser, MD [facility]

## 2016-11-24 DIAGNOSIS — Z8679 Personal history of other diseases of the circulatory system: Secondary | ICD-10-CM | POA: Insufficient documentation

## 2016-11-24 DIAGNOSIS — O0932 Supervision of pregnancy with insufficient antenatal care, second trimester: Secondary | ICD-10-CM | POA: Insufficient documentation

## 2016-11-24 DIAGNOSIS — O09899 Supervision of other high risk pregnancies, unspecified trimester: Secondary | ICD-10-CM | POA: Insufficient documentation

## 2016-11-24 DIAGNOSIS — O34219 Maternal care for unspecified type scar from previous cesarean delivery: Secondary | ICD-10-CM | POA: Insufficient documentation

## 2016-11-24 DIAGNOSIS — Z8759 Personal history of other complications of pregnancy, childbirth and the puerperium: Secondary | ICD-10-CM

## 2016-11-24 DIAGNOSIS — Z3482 Encounter for supervision of other normal pregnancy, second trimester: Secondary | ICD-10-CM | POA: Insufficient documentation

## 2016-11-24 LAB — AFP, SERUM, OPEN SPINA BIFIDA
AFP MoM: 0.94
AFP Value: 55.6 ng/mL
Gest. Age on Collection Date: 20 weeks
Maternal Age At EDD: 28.4 yr
OSBR Risk 1 IN: 10000
Test Results:: NEGATIVE
Weight: 159 [lb_av]

## 2016-11-28 ENCOUNTER — Telehealth: Payer: Self-pay

## 2016-11-28 NOTE — Telephone Encounter (Signed)
Called number listed in chart. Line states that is is no longer in service. No other number listed. Will inform pt at her next appt.

## 2016-11-28 NOTE — Telephone Encounter (Signed)
-----   Message from Hildred Laser, MD sent at 11/26/2016  5:56 PM EDT ----- Please inform patient of normal spina bifida screen.

## 2016-12-01 LAB — MATERNIT21  PLUS CORE+ESS+SCA, BLOOD

## 2016-12-08 IMAGING — MR MR HEAD W/O CM
10 of 11 series · 37 of 48 positions shown · non-contrast
Comparison: None.

CLINICAL DATA: Recent childbirth.  Hypertension and headache.

EXAM:
MRI HEAD WITHOUT CONTRAST
MRV HEAD WITHOUT CONTRAST
TECHNIQUE: Multiplanar, multiecho pulse sequences of the brain and surrounding
structures were obtained without intravenous contrast. Angiographic
images of the head were obtained using MRV technique without
contrast.

[Series 3: T1 · sagittal · 5.0mm · 0.45mm/px · 2 of 25 slices shown (1 of 2)]
[im 1/25]
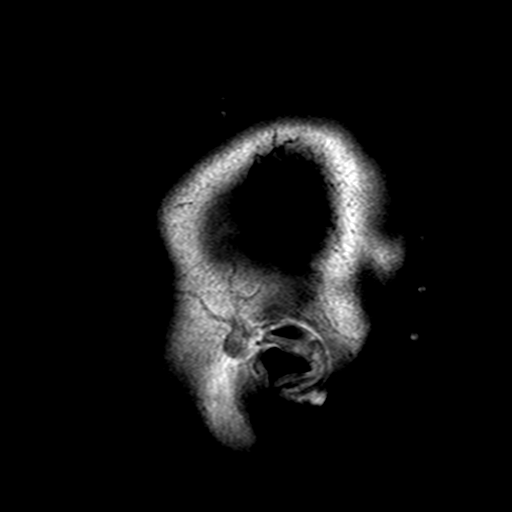
[im 25/25]
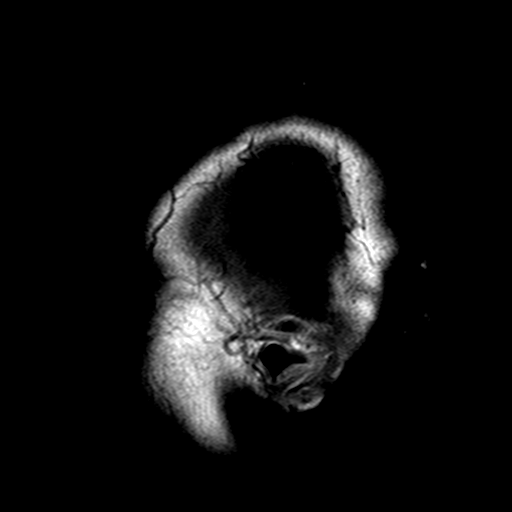

[Series 5: DWI · axial · 4.0mm · 0.94mm/px · z∈[-88,+61]mm · 4 of 40 slices shown (1 of 2)]
[im 1/40]
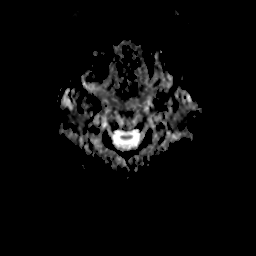
[im 14/40]
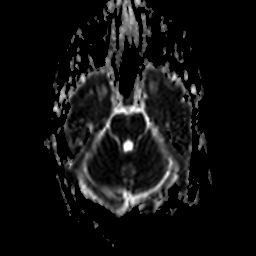
[im 27/40]
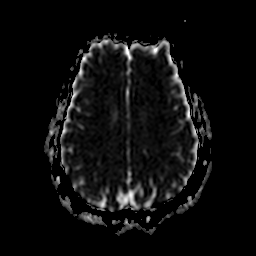
[im 40/40]
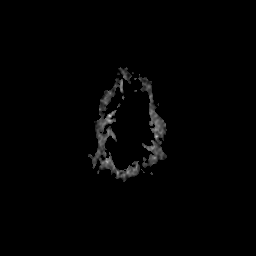

[Series 6: (id) ax · axial · 4.0mm · 0.94mm/px · z∈[-88,+61]mm · 5 of 40 slices shown]
[im 1/40]
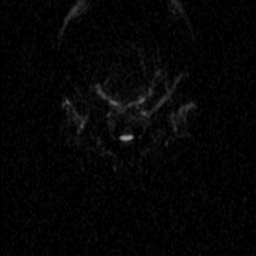
[im 10/40]
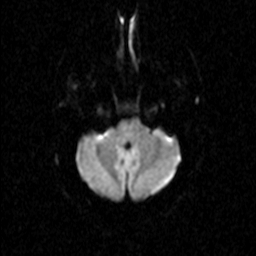
[im 20/40]
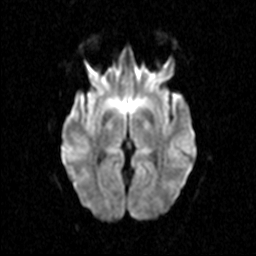
[im 30/40]
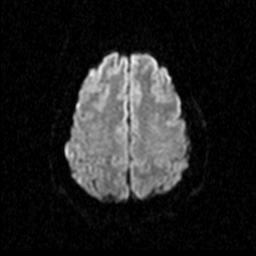
[im 40/40]
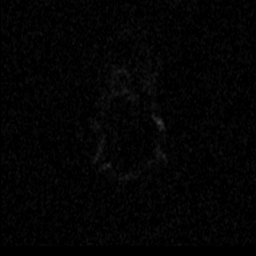

[Series 8: DWI · coronal · 5.0mm · 1.80mm/px · 4 of 35 slices shown (2 of 2)]
[im 1/35]
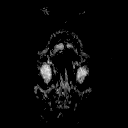
[im 12/35]
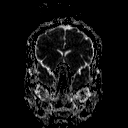
[im 23/35]
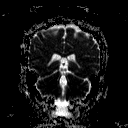
[im 35/35]
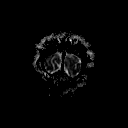

[Series 9: tof_2d_mrv · coronal · 3.0mm · 0.98mm/px · 4 of 100 slices shown]
[im 1/100]
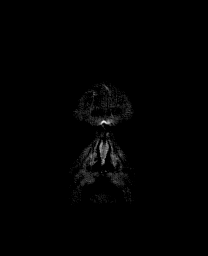
[im 20/100]
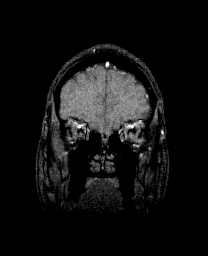
[im 30/100]
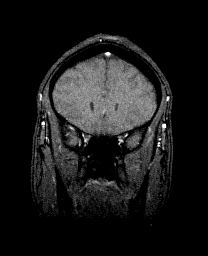
[im 40/100]
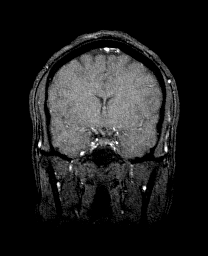

[Series 10: T2 · axial · 5.0mm · 0.45mm/px · z∈[-84,+59]mm · 3 of 24 slices shown (1 of 3)]
[im 1/24]
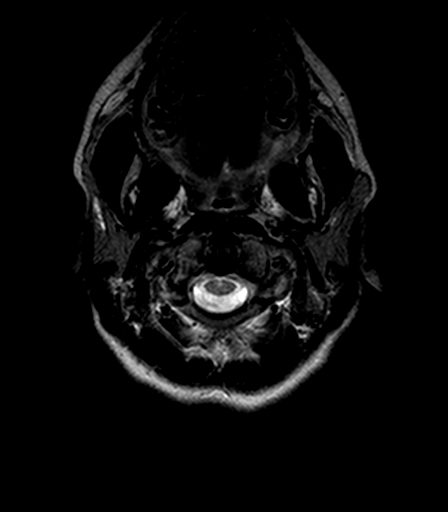
[im 12/24]
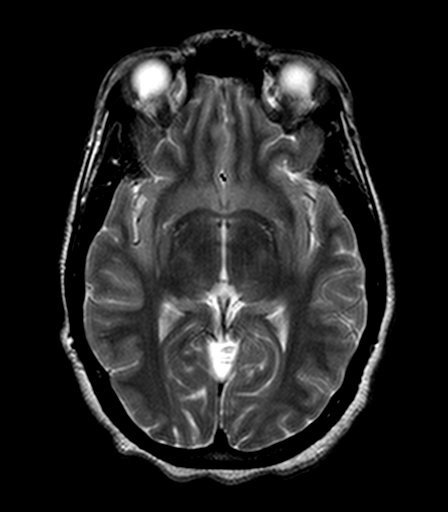
[im 24/24]
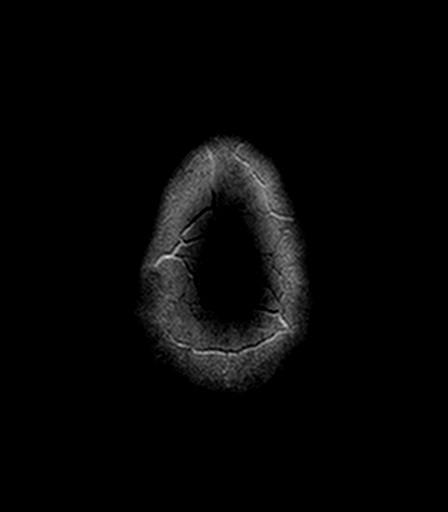

[Series 11: FLAIR · axial · 5.0mm · 0.90mm/px · z∈[-84,+59]mm · 3 of 24 slices shown]
[im 1/24]
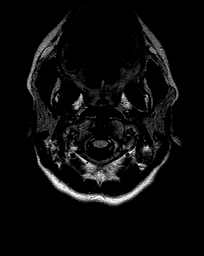
[im 12/24]
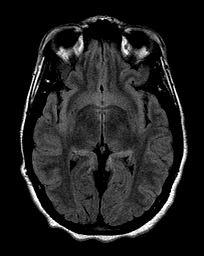
[im 24/24]
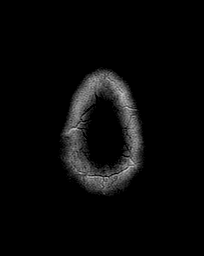

[Series 12: T2 · axial · 5.0mm · 0.45mm/px · z∈[-84,+59]mm · 3 of 24 slices shown (2 of 3)]
[im 1/24]
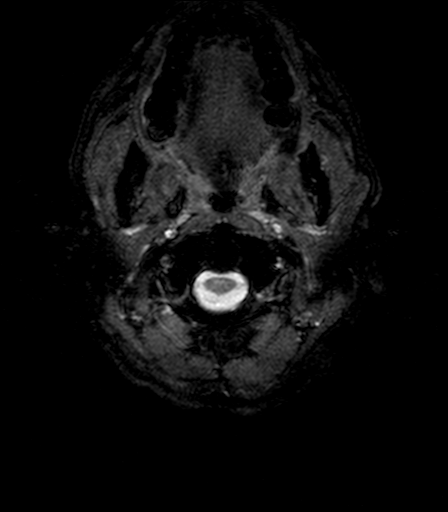
[im 12/24]
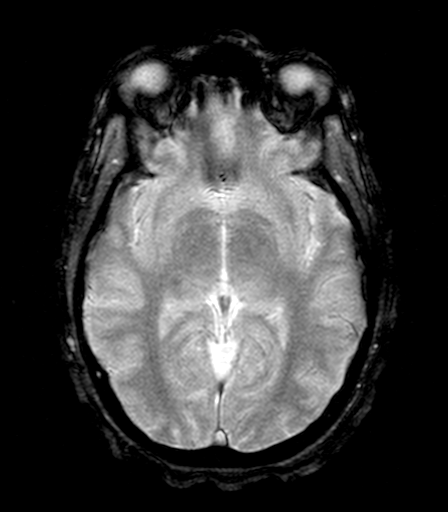
[im 24/24]
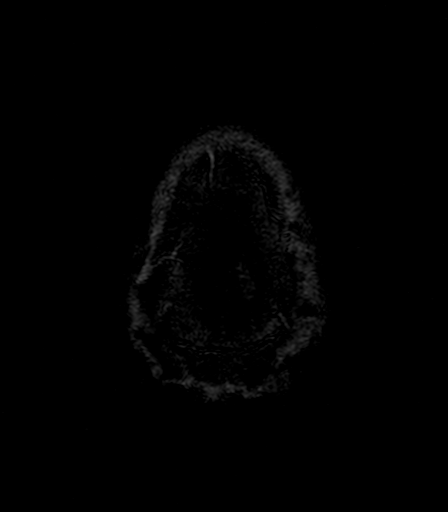

[Series 14: T1 · axial · 3.0mm · 0.45mm/px · z∈[-86,+61]mm · 6 of 52 slices shown (2 of 2)]
[im 1/52]
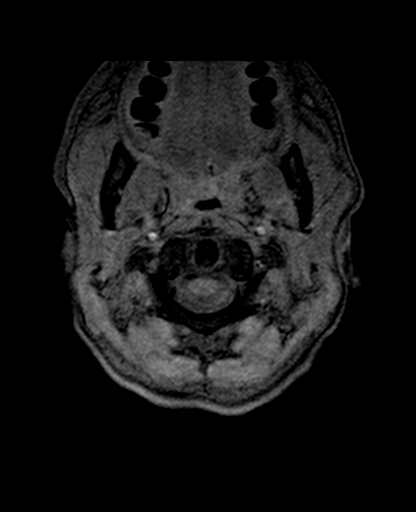
[im 11/52]
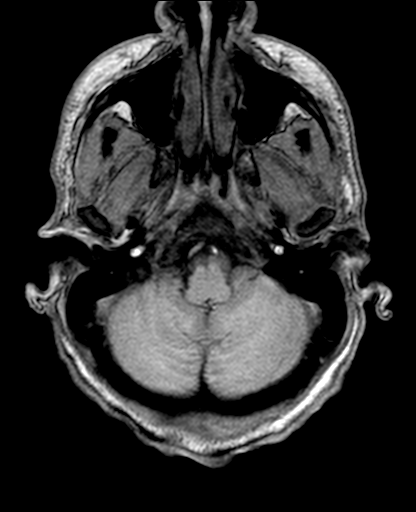
[im 21/52]
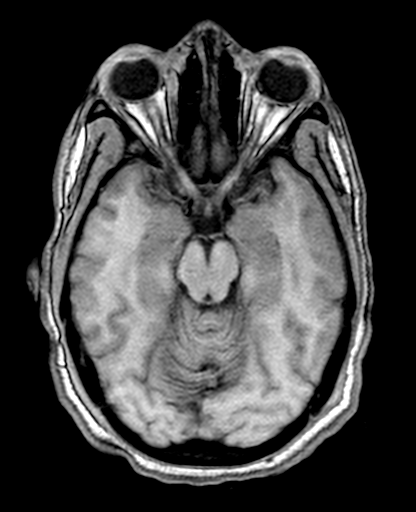
[im 31/52]
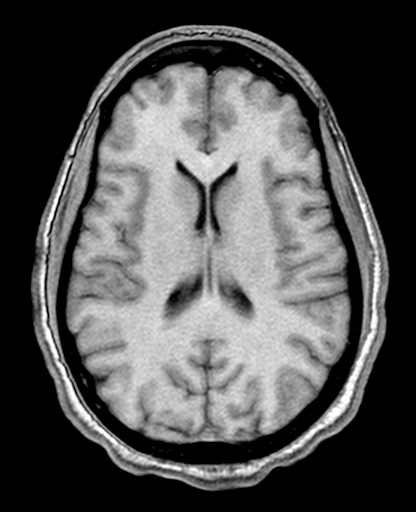
[im 41/52]
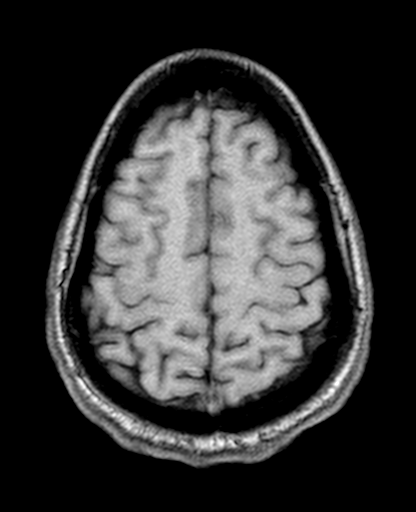
[im 52/52]
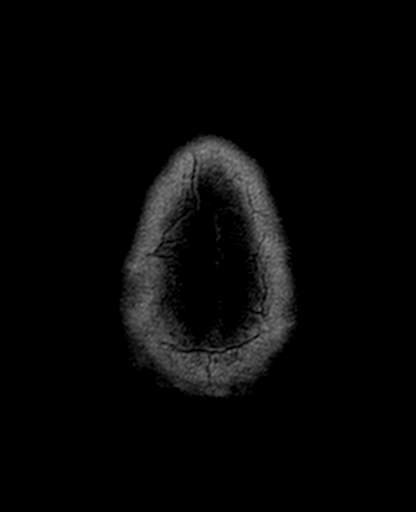

[Series 18: T2 · coronal · 5.0mm · 0.45mm/px · 3 of 27 slices shown (3 of 3)]
[im 1/27]
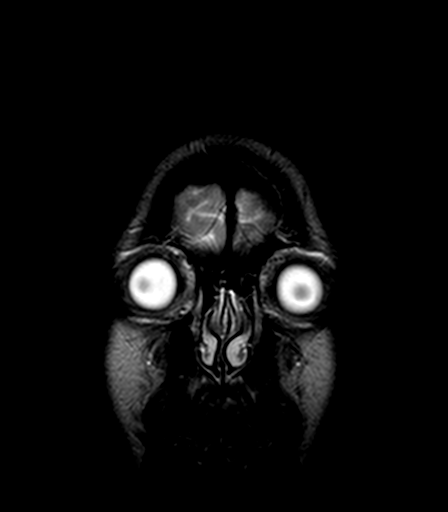
[im 14/27]
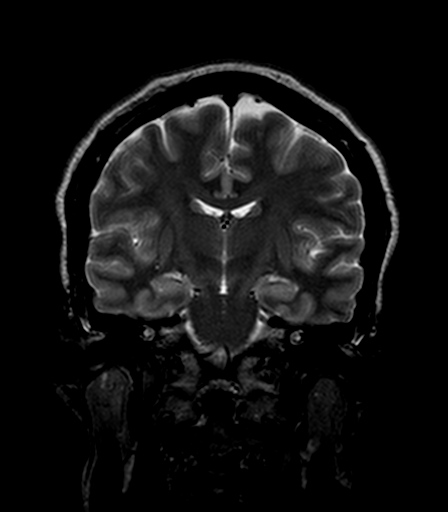
[im 27/27]
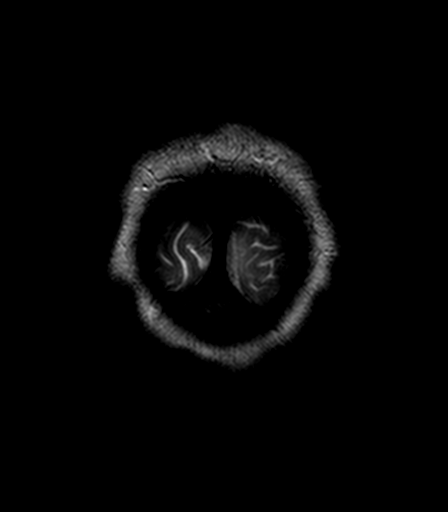

[37 of 48 positions shown; findings below may reference images not displayed]

FINDINGS: MRI HEAD FINDINGS

Brain: The brain has normal appearance without evidence of
malformation, atrophy, old or acute small or large vessel
infarction, hemorrhage, hydrocephalus or extra-axial collection. No
pituitary abnormality.

Vascular: Major vessels at the base of the brain show flow.

Skull and upper cervical spine: Normal

Sinuses/Orbits: Clear/ normal.

Other: None significant. Small amount of benign fat present within
the substance of the right parotid gland.

MRV HEAD FINDINGS

Superior sagittal sinus is widely patent. Deep venous system is
widely patent. Transverse sinuses and jugular veins are widely
patent. No missing superficial veins identified.
IMPRESSION: Normal MRI of the brain.

Normal MR venography.

## 2016-12-25 ENCOUNTER — Ambulatory Visit (INDEPENDENT_AMBULATORY_CARE_PROVIDER_SITE_OTHER): Payer: Medicaid Other | Admitting: Obstetrics and Gynecology

## 2016-12-25 ENCOUNTER — Encounter: Payer: Self-pay | Admitting: Obstetrics and Gynecology

## 2016-12-25 VITALS — BP 129/66 | HR 80 | Wt 162.1 lb

## 2016-12-25 DIAGNOSIS — Z3482 Encounter for supervision of other normal pregnancy, second trimester: Secondary | ICD-10-CM

## 2016-12-25 LAB — POCT URINALYSIS DIPSTICK
Bilirubin, UA: NEGATIVE
Blood, UA: NEGATIVE
Glucose, UA: NEGATIVE
Ketones, UA: NEGATIVE
Leukocytes, UA: NEGATIVE
Nitrite, UA: NEGATIVE
Protein, UA: NEGATIVE
Spec Grav, UA: 1.01 (ref 1.010–1.025)
Urobilinogen, UA: 0.2 E.U./dL
pH, UA: 7 (ref 5.0–8.0)

## 2016-12-25 NOTE — Progress Notes (Signed)
ROB patient describes no problems.she has expressed interest in a repeat cesarean delivery with tubal ligation. Advised to sign papers at 28-32 weeks. Active fetal movement. No further bleeding. Needs one hour GCT with next visit.

## 2017-01-24 ENCOUNTER — Encounter: Payer: Medicaid Other | Admitting: Obstetrics and Gynecology

## 2017-01-24 ENCOUNTER — Other Ambulatory Visit: Payer: Medicaid Other

## 2017-01-31 ENCOUNTER — Other Ambulatory Visit: Payer: Medicaid Other

## 2017-01-31 ENCOUNTER — Ambulatory Visit (INDEPENDENT_AMBULATORY_CARE_PROVIDER_SITE_OTHER): Payer: Medicaid Other | Admitting: Obstetrics and Gynecology

## 2017-01-31 VITALS — BP 120/69 | HR 79 | Wt 169.4 lb

## 2017-01-31 DIAGNOSIS — Z3483 Encounter for supervision of other normal pregnancy, third trimester: Secondary | ICD-10-CM

## 2017-01-31 DIAGNOSIS — O34219 Maternal care for unspecified type scar from previous cesarean delivery: Secondary | ICD-10-CM

## 2017-01-31 LAB — POCT URINALYSIS DIPSTICK
Bilirubin, UA: NEGATIVE
Blood, UA: NEGATIVE
Glucose, UA: NEGATIVE
Ketones, UA: NEGATIVE
Leukocytes, UA: NEGATIVE
Nitrite, UA: NEGATIVE
Protein, UA: NEGATIVE
Spec Grav, UA: 1.02 (ref 1.010–1.025)
Urobilinogen, UA: 0.2 E.U./dL
pH, UA: 6.5 (ref 5.0–8.0)

## 2017-01-31 NOTE — Progress Notes (Signed)
ROB: Has no complaints today.  We discussed scheduling for CD with tubal. (Likely on 2nd, 3rd or 4th).  To sign tubal papers when C-section scheduled.

## 2017-01-31 NOTE — Addendum Note (Signed)
Addended by: Brooke DareSICK, Alazae Crymes L on: 01/31/2017 02:12 PM   Modules accepted: Orders

## 2017-02-01 LAB — CBC
Hematocrit: 31.2 % — ABNORMAL LOW (ref 34.0–46.6)
Hemoglobin: 10.4 g/dL — ABNORMAL LOW (ref 11.1–15.9)
MCH: 26.8 pg (ref 26.6–33.0)
MCHC: 33.3 g/dL (ref 31.5–35.7)
MCV: 80 fL (ref 79–97)
Platelets: 182 10*3/uL (ref 150–379)
RBC: 3.88 x10E6/uL (ref 3.77–5.28)
RDW: 14.8 % (ref 12.3–15.4)
WBC: 7.4 10*3/uL (ref 3.4–10.8)

## 2017-02-01 LAB — GLUCOSE, 1 HOUR GESTATIONAL: Gestational Diabetes Screen: 64 mg/dL — ABNORMAL LOW (ref 65–139)

## 2017-02-14 ENCOUNTER — Ambulatory Visit (INDEPENDENT_AMBULATORY_CARE_PROVIDER_SITE_OTHER): Payer: Medicaid Other | Admitting: Obstetrics and Gynecology

## 2017-02-14 ENCOUNTER — Encounter: Payer: Self-pay | Admitting: Obstetrics and Gynecology

## 2017-02-14 VITALS — BP 108/64 | HR 85 | Wt 170.3 lb

## 2017-02-14 DIAGNOSIS — Z3483 Encounter for supervision of other normal pregnancy, third trimester: Secondary | ICD-10-CM

## 2017-02-14 DIAGNOSIS — O34219 Maternal care for unspecified type scar from previous cesarean delivery: Secondary | ICD-10-CM

## 2017-02-14 DIAGNOSIS — Z23 Encounter for immunization: Secondary | ICD-10-CM

## 2017-02-14 NOTE — Progress Notes (Signed)
ROB: Patient doing well, no complaints.  Discussed C-section date, patient prefers 04/04/17.  Informed of normal glucola, mild anemia. Recommend starting a daily iron supplement. Will change prenatals to include iron (patient reports h/o poor compliance in the past with iron pills)..  Desires to breast feed,  desires BTL for contraception, Medicaid forms signed today. For Tdap today, declined flu vaccine. Signed blood consent, discussed cord blood banking. Unable to void today.  RTC in 2 weeks.

## 2017-02-14 NOTE — Progress Notes (Signed)
ROB- Pt states she does not have no complaints today. Declines flu,

## 2017-02-15 ENCOUNTER — Telehealth: Payer: Self-pay | Admitting: Obstetrics and Gynecology

## 2017-02-15 NOTE — Telephone Encounter (Signed)
Called pt and informed her of Dr. Oretha Milchherry's message below. Pt understood and would like additional iron samples. These will be placed up front for pick up.  Laurie Petersen, [name], MD  [name], [name], [name]        Please call patient and inform her that I initially planned on changing her prenatal vitamin, however she is currently already taking one with additional iron in it. I would recommend her increasing her intake of iron rich foods, otherwise, she still may have to take an additional iron supplement. Also, we do have samples in the office for her to try if she would like.    Dr. Valentino Saxonherry

## 2017-02-28 ENCOUNTER — Ambulatory Visit (INDEPENDENT_AMBULATORY_CARE_PROVIDER_SITE_OTHER): Payer: Medicaid Other | Admitting: Obstetrics and Gynecology

## 2017-02-28 ENCOUNTER — Encounter: Payer: Self-pay | Admitting: Obstetrics and Gynecology

## 2017-02-28 VITALS — BP 115/67 | HR 84 | Wt 169.2 lb

## 2017-02-28 DIAGNOSIS — Z3483 Encounter for supervision of other normal pregnancy, third trimester: Secondary | ICD-10-CM

## 2017-02-28 DIAGNOSIS — O34219 Maternal care for unspecified type scar from previous cesarean delivery: Secondary | ICD-10-CM

## 2017-02-28 LAB — POCT URINALYSIS DIPSTICK
Bilirubin, UA: NEGATIVE
Blood, UA: NEGATIVE
Glucose, UA: NEGATIVE
Ketones, UA: NEGATIVE
Leukocytes, UA: NEGATIVE
Nitrite, UA: NEGATIVE
Protein, UA: NEGATIVE
Spec Grav, UA: 1.02 (ref 1.010–1.025)
Urobilinogen, UA: 0.2 E.U./dL
pH, UA: 5 (ref 5.0–8.0)

## 2017-02-28 NOTE — Progress Notes (Signed)
ROB; no complaints.  Doing well.  Still working.  Reaffirmed her desire for tubal ligation.  Papers signed last visit.

## 2017-03-14 ENCOUNTER — Encounter: Payer: Self-pay | Admitting: Obstetrics and Gynecology

## 2017-03-14 ENCOUNTER — Ambulatory Visit (INDEPENDENT_AMBULATORY_CARE_PROVIDER_SITE_OTHER): Payer: Medicaid Other | Admitting: Obstetrics and Gynecology

## 2017-03-14 VITALS — BP 115/72 | HR 86 | Wt 172.0 lb

## 2017-03-14 DIAGNOSIS — Z3483 Encounter for supervision of other normal pregnancy, third trimester: Secondary | ICD-10-CM

## 2017-03-14 DIAGNOSIS — Z113 Encounter for screening for infections with a predominantly sexual mode of transmission: Secondary | ICD-10-CM

## 2017-03-14 DIAGNOSIS — R102 Pelvic and perineal pain: Secondary | ICD-10-CM

## 2017-03-14 DIAGNOSIS — Z3685 Encounter for antenatal screening for Streptococcus B: Secondary | ICD-10-CM

## 2017-03-14 DIAGNOSIS — O26893 Other specified pregnancy related conditions, third trimester: Secondary | ICD-10-CM

## 2017-03-14 NOTE — Progress Notes (Signed)
ROB- Pt has been c/o lots of pressure and pain epically when she stands up and has been having some nausea

## 2017-03-14 NOTE — Progress Notes (Signed)
ROB: Patient notes lots of pressure and pain, mostly with prolonged sitting at job.  Patient requesting work notice.  Notes her job is ok with her being out.  36 week labs done today. Patient unable to void today. RTC in 1 week.

## 2017-03-16 LAB — STREP GP B NAA: Strep Gp B NAA: NEGATIVE

## 2017-03-16 LAB — GC/CHLAMYDIA PROBE AMP
Chlamydia trachomatis, NAA: NEGATIVE
Neisseria gonorrhoeae by PCR: NEGATIVE

## 2017-03-21 ENCOUNTER — Ambulatory Visit (INDEPENDENT_AMBULATORY_CARE_PROVIDER_SITE_OTHER): Payer: Medicaid Other | Admitting: Obstetrics and Gynecology

## 2017-03-21 ENCOUNTER — Encounter: Payer: Self-pay | Admitting: Obstetrics and Gynecology

## 2017-03-21 VITALS — BP 118/72 | HR 72 | Wt 170.0 lb

## 2017-03-21 DIAGNOSIS — O34219 Maternal care for unspecified type scar from previous cesarean delivery: Secondary | ICD-10-CM

## 2017-03-21 DIAGNOSIS — R310 Gross hematuria: Secondary | ICD-10-CM

## 2017-03-21 DIAGNOSIS — Z3483 Encounter for supervision of other normal pregnancy, third trimester: Secondary | ICD-10-CM

## 2017-03-21 LAB — POCT URINALYSIS DIPSTICK
Bilirubin, UA: NEGATIVE
Glucose, UA: NEGATIVE
Leukocytes, UA: NEGATIVE
Nitrite, UA: NEGATIVE
Spec Grav, UA: 1.02 (ref 1.010–1.025)
Urobilinogen, UA: 0.2 E.U./dL
pH, UA: 7 (ref 5.0–8.0)

## 2017-03-21 NOTE — Progress Notes (Signed)
ROB: Cesarean scheduled for 04-04-17.  No contractions.  Patient without complaint.  No urinary symptoms but blood noted-urine sent for C&S.

## 2017-03-23 LAB — URINE CULTURE: Organism ID, Bacteria: NO GROWTH

## 2017-03-27 ENCOUNTER — Inpatient Hospital Stay
Admission: EM | Admit: 2017-03-27 | Discharge: 2017-03-27 | Disposition: A | Discharge status: Home / Self Care | Payer: Medicaid Other | Attending: Obstetrics and Gynecology | Admitting: Obstetrics and Gynecology

## 2017-03-27 DIAGNOSIS — O26893 Other specified pregnancy related conditions, third trimester: Secondary | ICD-10-CM | POA: Diagnosis not present

## 2017-03-27 DIAGNOSIS — O479 False labor, unspecified: Secondary | ICD-10-CM | POA: Diagnosis present

## 2017-03-27 DIAGNOSIS — Z3A37 37 weeks gestation of pregnancy: Secondary | ICD-10-CM | POA: Diagnosis not present

## 2017-03-27 LAB — URINALYSIS, ROUTINE W REFLEX MICROSCOPIC
Bilirubin Urine: NEGATIVE
Glucose, UA: NEGATIVE mg/dL
Hgb urine dipstick: NEGATIVE
Ketones, ur: NEGATIVE mg/dL
Leukocytes, UA: NEGATIVE
Nitrite: NEGATIVE
Protein, ur: NEGATIVE mg/dL
Specific Gravity, Urine: 1.013 (ref 1.005–1.030)
pH: 7 (ref 5.0–8.0)

## 2017-03-27 NOTE — Final Progress Note (Signed)
L&D OB Triage Note  Tonye BecketBrittany Curt is a 26 y.o. [phone] female at 3058w6d, EDD Estimated Date of Delivery: 04/11/17 who presented to triage for complaints of contractions since approximately 2 pm today.  Of note, patient has a h/o prior C-section x 1, desiring repeat.  She was evaluated by the nurses with no significant findings for labor. Vital signs stable. An NST was performed and has been reviewed by MD. She was treated with PO hydration.    NST INTERPRETATION: Indications: rule out uterine contractions  Mode: External Baseline Rate (A): 110 bpm Variability: Moderate Accelerations: 15 x 15 Decelerations: None     Contraction Frequency (min): occasional  Impression: reactive   Plan: NST performed was reviewed and was found to be reactive. She was discharged home with bleeding/labor precautions.  Continue routine prenatal care. Follow up with OB/GYN as previously scheduled.     Hildred LaserAnika Dinari Stgermaine, MD [facility]

## 2017-03-29 ENCOUNTER — Ambulatory Visit (INDEPENDENT_AMBULATORY_CARE_PROVIDER_SITE_OTHER): Payer: Medicaid Other | Admitting: Obstetrics and Gynecology

## 2017-03-29 ENCOUNTER — Encounter: Payer: Self-pay | Admitting: Obstetrics and Gynecology

## 2017-03-29 VITALS — BP 121/70 | HR 65 | Wt 171.1 lb

## 2017-03-29 DIAGNOSIS — Z3483 Encounter for supervision of other normal pregnancy, third trimester: Secondary | ICD-10-CM

## 2017-03-29 LAB — POCT URINALYSIS DIPSTICK
Bilirubin, UA: NEGATIVE
Blood, UA: NEGATIVE
Glucose, UA: NEGATIVE
Leukocytes, UA: NEGATIVE
Nitrite, UA: NEGATIVE
Spec Grav, UA: 1.02 (ref 1.010–1.025)
Urobilinogen, UA: 0.2 E.U./dL
pH, UA: 7 (ref 5.0–8.0)

## 2017-03-29 NOTE — Progress Notes (Signed)
ROB: Patient without complaint.  Reports normal fetal movement.  C-section scheduled for next week patient desires tubal.  All questions answered.

## 2017-04-02 ENCOUNTER — Other Ambulatory Visit: Payer: Self-pay

## 2017-04-02 ENCOUNTER — Encounter
Admission: RE | Admit: 2017-04-02 | Discharge: 2017-04-02 | Disposition: A | Discharge status: Home / Self Care | Payer: Medicaid Other | Source: Ambulatory Visit | Attending: Obstetrics and Gynecology | Admitting: Obstetrics and Gynecology

## 2017-04-02 DIAGNOSIS — Z3A38 38 weeks gestation of pregnancy: Secondary | ICD-10-CM | POA: Diagnosis not present

## 2017-04-02 DIAGNOSIS — Z01812 Encounter for preprocedural laboratory examination: Secondary | ICD-10-CM | POA: Diagnosis not present

## 2017-04-02 DIAGNOSIS — O34219 Maternal care for unspecified type scar from previous cesarean delivery: Secondary | ICD-10-CM | POA: Insufficient documentation

## 2017-04-02 HISTORY — DX: Anemia, unspecified: D64.9

## 2017-04-02 LAB — TYPE AND SCREEN
ABO/RH(D): O POS
Antibody Screen: NEGATIVE
Extend sample reason: UNDETERMINED

## 2017-04-02 NOTE — Pre-Procedure Instructions (Addendum)
No pre-op orders from Dr. Logan BoresEvans at time of PAT visit.  Left message at Dr. Logan BoresEvans' office on voicemail regarding need for pre-op orders for labs for PAT visit.  Called Back to Dr. Michel HarrowEvan's office later that morning, spoke with Clydie BraunKaren, she checked with Dr. Michel HarrowEvan's nurse, she has let Dr. Michel HarrowEvan's know we need pre-op orders for today's labs and is waiting for him to enter the orders.

## 2017-04-02 NOTE — Patient Instructions (Signed)
  Your procedure is scheduled on: Wednesday Jan. 2nd, 2019. Report to Emergency Room at 5:00 am, they will take you to the BirthPlace..  Remember: Instructions that are not followed completely may result in serious medical risk, up to and including death, or upon the discretion of your surgeon and anesthesiologist your surgery may need to be rescheduled.    _x___ 1. Do not eat or drink food after midnight night prior to surgery.     No gum chewing or hard candies, snacks or breakfast.         ____ 2. No Alcohol for 24 hours before or after surgery.   ____ 3. Bring all medications with you on the day of surgery if instructed.    __x__ 4. Notify your doctor if there is any change in your medical condition     (cold, fever, infections).    _____ 5.   Do Not Smoke or use e-cigarettes For 24 Hours Prior to Your   Surgery.  Do not use any chewable tobacco products for at least 6   hours prior to  surgery.                  Do not wear jewelry, make-up, hairpins, clips or nail polish.  Do not wear lotions, powders, or perfumes.   Do not shave 48 hours prior to surgery. Men may shave face and neck.  Do not bring valuables to the hospital.    [facility] is not responsible for any belongings or valuables.               Contacts, dentures or bridgework may not be worn into surgery.  Leave your suitcase in the car. After surgery it may be brought to your room.  For patients admitted to the hospital, discharge time is determined by your  treatment team.   Patients discharged the day of surgery will not be allowed to drive home.    Please read over the following fact sheets that you were given:   [facility] Preparing for [facility]             ____ Take these medicines the morning of surgery with A SIP OF WATER: None     ____ Fleet Enema (as directed)   _x___ Use CHG Soap as directed on instruction sheet  ____ Use inhalers on the day of surgery and bring to hospital day of  surgery  ____ Stop metformin 2 days prior to surgery    ____ Take 1/2 of usual insulin dose the night before surgery and none on the morning of          surgery.   ____ Stop Eliquis/Coumadin/Plavix/aspirin on does not apply.  __x__ Stop Anti-inflammatories such as Advil, Aleve, Ibuprofen, Motrin, Naproxen,  Naprosyn, Goodies powders or aspirin products. OK to take Tylenol.   ____ Stop supplements until after surgery.    ____ Bring C-Pap to the hospital.

## 2017-04-04 ENCOUNTER — Encounter
Admission: EM | Disposition: A | Discharge status: Home / Self Care | Payer: Self-pay | Source: Home / Self Care | Attending: Obstetrics and Gynecology

## 2017-04-04 ENCOUNTER — Inpatient Hospital Stay
Admission: EM | Admit: 2017-04-04 | Discharge: 2017-04-06 | DRG: 785 | Disposition: A | Discharge status: Home / Self Care | Payer: Medicaid Other | Attending: Obstetrics and Gynecology | Admitting: Obstetrics and Gynecology

## 2017-04-04 ENCOUNTER — Inpatient Hospital Stay: Payer: Medicaid Other | Admitting: Anesthesiology

## 2017-04-04 ENCOUNTER — Other Ambulatory Visit: Payer: Self-pay

## 2017-04-04 ENCOUNTER — Inpatient Hospital Stay
Admission: RE | Admit: 2017-04-04 | Payer: Medicaid Other | Source: Ambulatory Visit | Admitting: Obstetrics and Gynecology

## 2017-04-04 DIAGNOSIS — Z87891 Personal history of nicotine dependence: Secondary | ICD-10-CM

## 2017-04-04 DIAGNOSIS — Z3A39 39 weeks gestation of pregnancy: Secondary | ICD-10-CM | POA: Diagnosis not present

## 2017-04-04 DIAGNOSIS — O34211 Maternal care for low transverse scar from previous cesarean delivery: Secondary | ICD-10-CM | POA: Diagnosis present

## 2017-04-04 DIAGNOSIS — O9902 Anemia complicating childbirth: Secondary | ICD-10-CM | POA: Diagnosis present

## 2017-04-04 DIAGNOSIS — D649 Anemia, unspecified: Secondary | ICD-10-CM | POA: Diagnosis present

## 2017-04-04 DIAGNOSIS — Z302 Encounter for sterilization: Secondary | ICD-10-CM

## 2017-04-04 LAB — CBC WITH DIFFERENTIAL/PLATELET
Basophils Absolute: 0 10*3/uL (ref 0–0.1)
Basophils Relative: 0 %
Eosinophils Absolute: 0.1 10*3/uL (ref 0–0.7)
Eosinophils Relative: 1 %
HCT: 29.1 % — ABNORMAL LOW (ref 35.0–47.0)
Hemoglobin: 9.5 g/dL — ABNORMAL LOW (ref 12.0–16.0)
Lymphocytes Relative: 15 %
Lymphs Abs: 1.2 10*3/uL (ref 1.0–3.6)
MCH: 24.9 pg — ABNORMAL LOW (ref 26.0–34.0)
MCHC: 32.6 g/dL (ref 32.0–36.0)
MCV: 76.4 fL — ABNORMAL LOW (ref 80.0–100.0)
Monocytes Absolute: 0.6 10*3/uL (ref 0.2–0.9)
Monocytes Relative: 7 %
Neutro Abs: 6.1 10*3/uL (ref 1.4–6.5)
Neutrophils Relative %: 77 %
Platelets: 147 10*3/uL — ABNORMAL LOW (ref 150–440)
RBC: 3.81 MIL/uL (ref 3.80–5.20)
RDW: 15 % — ABNORMAL HIGH (ref 11.5–14.5)
WBC: 8 10*3/uL (ref 3.6–11.0)

## 2017-04-04 SURGERY — Surgical Case
Anesthesia: Spinal | Laterality: Bilateral

## 2017-04-04 MED ORDER — OXYTOCIN 40 UNITS IN LACTATED RINGERS INFUSION - SIMPLE MED
INTRAVENOUS | Status: DC | PRN
  Administered 2017-04-04: 700 mL via INTRAVENOUS

## 2017-04-04 MED ORDER — DEXTROSE 5 % IV SOLN
2.0000 g | Freq: Once | INTRAVENOUS | Status: AC
  Administered 2017-04-04: 2 g via INTRAVENOUS
  Filled 2017-04-04: qty 2000

## 2017-04-04 MED ORDER — NALBUPHINE HCL 10 MG/ML IJ SOLN: 5.0000 mg | INTRAMUSCULAR | Status: DC | PRN

## 2017-04-04 MED ORDER — NALOXONE HCL 0.4 MG/ML IJ SOLN: 0.4000 mg | INTRAMUSCULAR | Status: DC | PRN

## 2017-04-04 MED ORDER — MEPERIDINE HCL 25 MG/ML IJ SOLN: 6.2500 mg | INTRAMUSCULAR | Status: DC | PRN

## 2017-04-04 MED ORDER — ONDANSETRON HCL 4 MG/2ML IJ SOLN: 4.0000 mg | Freq: Three times a day (TID) | INTRAMUSCULAR | Status: DC | PRN

## 2017-04-04 MED ORDER — LIDOCAINE 5 % EX PTCH
1.0000 | MEDICATED_PATCH | CUTANEOUS | Status: DC
  Filled 2017-04-04: qty 1

## 2017-04-04 MED ORDER — DIPHENHYDRAMINE HCL 25 MG PO CAPS
25.0000 mg | ORAL_CAPSULE | ORAL | Status: DC | PRN
  Administered 2017-04-05 (×2): 25 mg via ORAL
  Filled 2017-04-04 (×2): qty 1

## 2017-04-04 MED ORDER — LACTATED RINGERS IV SOLN
INTRAVENOUS | Status: DC
  Administered 2017-04-04: 06:00:00 via INTRAVENOUS

## 2017-04-04 MED ORDER — ACETAMINOPHEN 325 MG PO TABS
650.0000 mg | ORAL_TABLET | ORAL | Status: DC | PRN
  Administered 2017-04-05 (×2): 650 mg via ORAL
  Filled 2017-04-04 (×2): qty 2

## 2017-04-04 MED ORDER — KETOROLAC TROMETHAMINE 30 MG/ML IJ SOLN
30.0000 mg | Freq: Four times a day (QID) | INTRAMUSCULAR | Status: AC | PRN
  Administered 2017-04-04: 30 mg via INTRAVENOUS
  Filled 2017-04-04: qty 1

## 2017-04-04 MED ORDER — LIDOCAINE 5 % EX PTCH
MEDICATED_PATCH | CUTANEOUS | Status: DC | PRN
  Administered 2017-04-04: 1 via TRANSDERMAL

## 2017-04-04 MED ORDER — LACTATED RINGERS IV SOLN
INTRAVENOUS | Status: DC
  Administered 2017-04-04: 07:00:00 via INTRAVENOUS

## 2017-04-04 MED ORDER — CEFAZOLIN SODIUM 10 G IJ SOLR: 2.0000 g | Freq: Once | INTRAMUSCULAR | Status: DC

## 2017-04-04 MED ORDER — SODIUM CHLORIDE 0.9% FLUSH: 3.0000 mL | INTRAVENOUS | Status: DC | PRN

## 2017-04-04 MED ORDER — DIPHENHYDRAMINE HCL 50 MG/ML IJ SOLN
12.5000 mg | INTRAMUSCULAR | Status: DC | PRN
  Administered 2017-04-04: 12.5 mg via INTRAVENOUS
  Filled 2017-04-04: qty 1

## 2017-04-04 MED ORDER — ONDANSETRON HCL 4 MG/2ML IJ SOLN
INTRAMUSCULAR | Status: DC | PRN
  Administered 2017-04-04: 4 mg via INTRAVENOUS

## 2017-04-04 MED ORDER — DIPHENHYDRAMINE HCL 25 MG PO CAPS: 25.0000 mg | ORAL_CAPSULE | Freq: Four times a day (QID) | ORAL | Status: DC | PRN

## 2017-04-04 MED ORDER — KETAMINE HCL 50 MG/ML IJ SOLN
INTRAMUSCULAR | Status: DC | PRN
  Administered 2017-04-04: 25 mg via INTRAVENOUS

## 2017-04-04 MED ORDER — LACTATED RINGERS IV SOLN: INTRAVENOUS | Status: DC

## 2017-04-04 MED ORDER — PRENATAL MULTIVITAMIN CH
1.0000 | ORAL_TABLET | Freq: Every day | ORAL | Status: DC
  Administered 2017-04-04 – 2017-04-06 (×3): 1 via ORAL
  Filled 2017-04-04 (×3): qty 1

## 2017-04-04 MED ORDER — SOD CITRATE-CITRIC ACID 500-334 MG/5ML PO SOLN
30.0000 mL | Freq: Once | ORAL | Status: AC
  Administered 2017-04-04: 30 mL via ORAL
  Filled 2017-04-04: qty 15

## 2017-04-04 MED ORDER — OXYCODONE-ACETAMINOPHEN 5-325 MG PO TABS: 2.0000 | ORAL_TABLET | ORAL | Status: DC | PRN

## 2017-04-04 MED ORDER — MIDAZOLAM HCL 2 MG/2ML IJ SOLN
INTRAMUSCULAR | Status: DC | PRN
  Administered 2017-04-04 (×2): 1 mg via INTRAVENOUS

## 2017-04-04 MED ORDER — OXYCODONE HCL 5 MG PO TABS
10.0000 mg | ORAL_TABLET | ORAL | Status: DC | PRN
  Administered 2017-04-04 – 2017-04-06 (×7): 10 mg via ORAL
  Filled 2017-04-04 (×8): qty 2

## 2017-04-04 MED ORDER — OXYCODONE HCL 5 MG PO TABS
5.0000 mg | ORAL_TABLET | ORAL | Status: DC | PRN
  Administered 2017-04-05 – 2017-04-06 (×2): 5 mg via ORAL
  Filled 2017-04-04: qty 1

## 2017-04-04 MED ORDER — CEFAZOLIN SODIUM-DEXTROSE 2-4 GM/100ML-% IV SOLN: 2.0000 g | Freq: Once | INTRAVENOUS | Status: DC

## 2017-04-04 MED ORDER — MORPHINE SULFATE (PF) 0.5 MG/ML IJ SOLN
INTRAMUSCULAR | Status: DC | PRN
  Administered 2017-04-04: .2 mg via EPIDURAL

## 2017-04-04 MED ORDER — ZOLPIDEM TARTRATE 5 MG PO TABS: 5.0000 mg | ORAL_TABLET | Freq: Every evening | ORAL | Status: DC | PRN

## 2017-04-04 MED ORDER — FENTANYL CITRATE (PF) 100 MCG/2ML IJ SOLN
INTRAMUSCULAR | Status: DC | PRN
  Administered 2017-04-04 (×2): 50 ug via INTRAVENOUS

## 2017-04-04 MED ORDER — IBUPROFEN 600 MG PO TABS
600.0000 mg | ORAL_TABLET | Freq: Four times a day (QID) | ORAL | Status: DC
  Administered 2017-04-04 – 2017-04-06 (×8): 600 mg via ORAL
  Filled 2017-04-04 (×8): qty 1

## 2017-04-04 MED ORDER — OXYCODONE-ACETAMINOPHEN 5-325 MG PO TABS: 1.0000 | ORAL_TABLET | ORAL | Status: DC | PRN

## 2017-04-04 MED ORDER — OXYTOCIN 40 UNITS IN LACTATED RINGERS INFUSION - SIMPLE MED
INTRAVENOUS | Status: AC
  Filled 2017-04-04: qty 1000

## 2017-04-04 MED ORDER — FENTANYL CITRATE (PF) 100 MCG/2ML IJ SOLN: 25.0000 ug | INTRAMUSCULAR | Status: DC | PRN

## 2017-04-04 MED ORDER — MENTHOL 3 MG MT LOZG
1.0000 | LOZENGE | OROMUCOSAL | Status: DC | PRN
  Filled 2017-04-04: qty 9

## 2017-04-04 MED ORDER — ACETAMINOPHEN 500 MG PO TABS
1000.0000 mg | ORAL_TABLET | Freq: Four times a day (QID) | ORAL | Status: AC
  Administered 2017-04-04 – 2017-04-05 (×4): 1000 mg via ORAL
  Filled 2017-04-04 (×4): qty 2

## 2017-04-04 MED ORDER — OXYTOCIN 40 UNITS IN LACTATED RINGERS INFUSION - SIMPLE MED
2.5000 [IU]/h | INTRAVENOUS | Status: AC
  Administered 2017-04-04: 2.5 [IU]/h via INTRAVENOUS
  Filled 2017-04-04 (×2): qty 1000

## 2017-04-04 MED ORDER — ONDANSETRON HCL 4 MG/2ML IJ SOLN: 4.0000 mg | Freq: Once | INTRAMUSCULAR | Status: DC | PRN

## 2017-04-04 MED ORDER — PHENYLEPHRINE HCL 10 MG/ML IJ SOLN
INTRAMUSCULAR | Status: DC | PRN
  Administered 2017-04-04: 20 ug/min via INTRAVENOUS

## 2017-04-04 MED ORDER — SIMETHICONE 80 MG PO CHEW
80.0000 mg | CHEWABLE_TABLET | Freq: Four times a day (QID) | ORAL | Status: DC
  Administered 2017-04-04 – 2017-04-06 (×9): 80 mg via ORAL
  Filled 2017-04-04 (×9): qty 1

## 2017-04-04 MED ORDER — BUPIVACAINE IN DEXTROSE 0.75-8.25 % IT SOLN
INTRATHECAL | Status: DC | PRN
  Administered 2017-04-04: 1.7 mL via INTRATHECAL

## 2017-04-04 MED ORDER — KETOROLAC TROMETHAMINE 30 MG/ML IJ SOLN: 30.0000 mg | Freq: Four times a day (QID) | INTRAMUSCULAR | Status: AC | PRN

## 2017-04-04 MED ORDER — SENNOSIDES-DOCUSATE SODIUM 8.6-50 MG PO TABS
2.0000 | ORAL_TABLET | ORAL | Status: DC
  Administered 2017-04-06: 2 via ORAL
  Filled 2017-04-04: qty 2

## 2017-04-04 SURGICAL SUPPLY — 23 items
ADHESIVE MASTISOL STRL (MISCELLANEOUS) ×2 IMPLANT
BAG COUNTER SPONGE EZ (MISCELLANEOUS) ×2 IMPLANT
CANISTER SUCT 3000ML PPV (MISCELLANEOUS) ×2 IMPLANT
CELL SAVER LIPIGURD (MISCELLANEOUS) ×1 IMPLANT
CHLORAPREP W/TINT 26ML (MISCELLANEOUS) ×4 IMPLANT
DRSG TELFA 3X8 NADH (GAUZE/BANDAGES/DRESSINGS) ×2 IMPLANT
EXTRT SYSTEM ALEXIS 14CM (MISCELLANEOUS) ×2
GAUZE SPONGE 4X4 12PLY STRL (GAUZE/BANDAGES/DRESSINGS) ×2 IMPLANT
GLOVE INDICATOR 7.0 STRL GRN (GLOVE) ×2 IMPLANT
GLOVE ORTHO TXT STRL SZ7.5 (GLOVE) ×12 IMPLANT
GLOVE PROTEXIS LATEX SZ 7.5 (GLOVE) ×2 IMPLANT
GOWN STRL REUS W/ TWL LRG LVL3 (GOWN DISPOSABLE) ×2 IMPLANT
GOWN STRL REUS W/TWL LRG LVL3 (GOWN DISPOSABLE) ×2
KIT RM TURNOVER STRD PROC AR (KITS) ×2 IMPLANT
NS IRRIG 1000ML POUR BTL (IV SOLUTION) ×2 IMPLANT
PACK C SECTION AR (MISCELLANEOUS) ×2 IMPLANT
PAD OB MATERNITY 4.3X12.25 (PERSONAL CARE ITEMS) ×2 IMPLANT
PAD PREP 24X41 OB/GYN DISP (PERSONAL CARE ITEMS) ×2 IMPLANT
SPONGE LAP 18X18 5 PK (GAUZE/BANDAGES/DRESSINGS) IMPLANT
SUT VIC AB 0 CTX 36 (SUTURE) ×2
SUT VIC AB 0 CTX36XBRD ANBCTRL (SUTURE) ×2 IMPLANT
SUT VIC AB 1 CT1 36 (SUTURE) ×4 IMPLANT
SUT VICRYL+ 3-0 36IN CT-1 (SUTURE) ×2 IMPLANT

## 2017-04-04 NOTE — H&P (Signed)
History and Physical   HPI  Laurie Petersen is a 27 y.o. [phone] at [redacted]w[redacted]d Estimated Date of Delivery: 04/11/17 who is being admitted for  C-section with tubal sterilization   OB History  Obstetric History   G3   P2   T1   P1   A0   L3    SAB0   TAB0   Ectopic0   Multiple1   Live Births3     # Outcome Date GA Lbr Len/2nd Weight Sex Delivery Anes PTL Lv  3 Current           2A Preterm 01/25/16 [redacted]w[redacted]d  3 lb 11 oz (1.673 kg) F CS-Unspec   LIV  2B Preterm  [redacted]w[redacted]d  4 lb (1.814 kg) F CS-Unspec   LIV  1 Term 11/10/10 [redacted]w[redacted]d  6 lb 14 oz (3.118 kg) M Vag-Forceps  N LIV    Obstetric Comments  G2 - mono-mono twins. Delivered at [facility]    PROBLEM LIST  Pregnancy complications or risks: Patient Active Problem List   Diagnosis Date Noted  . Irregular uterine contractions 03/27/2017  . Late prenatal care affecting pregnancy in second trimester 11/24/2016  . H/O cesarean section complicating pregnancy 11/24/2016  . Supervision of normal intrauterine pregnancy in multigravida in second trimester 11/24/2016  . History of postpartum hypertension 11/24/2016  . Short interval between pregnancies affecting pregnancy, antepartum 11/24/2016  . Hypertensive emergency 02/09/2016  . Accelerated hypertension 02/08/2016    Prenatal labs and studies: ABO, Rh: --/--/O POS (12/31 0919) Antibody: NEG (12/31 0919) Rubella: 3.17 (07/25 1656) RPR: Non Reactive (07/25 1656)  HBsAg: Negative (07/25 1656)  HIV: Non Reactive (07/25 1656)  AVW:UJWJXBJY (12/12 1132)   Past Medical History:  Diagnosis Date  . Anemia   . Postpartum hypertension 01/2016     Past Surgical History:  Procedure Laterality Date  . CESAREAN SECTION    . TONSILLECTOMY       Medications      Medication List    ASK your doctor about these medications   CITRANATAL BLOOM 90-1 MG Tabs Take 90 mg by mouth daily.        Allergies  Patient has no known allergies.  Review of Systems  Pertinent items are noted in  HPI.  Physical Exam  BP (!) 147/92   Pulse (!) 55   Temp 98.3 F (36.8 C) (Oral)   Resp 18   Ht 5\' 6"  (1.676 m)   Wt 171 lb (77.6 kg)   LMP 07/13/2016 (Exact Date)   BMI 27.60 kg/m   Lungs:  CTA B Cardio: RRR without M/R/G Abd: Soft, gravid, NT Presentation: cephalic EXT: No C/C/ 1+ Edema DTRs: 2+ B CERVIX: not evaluated  See Prenatal records for more detailed PE.     FHR:  Variability: Good {> 6 bpm)  Toco: Uterine Contractions: None   Test Results  Results for orders placed or performed during the hospital encounter of 04/04/17 (from the past 24 hour(s))  CBC with Differential/Platelet     Status: Abnormal   Collection Time: 04/04/17  5:30 AM  Result Value Ref Range   WBC 8.0 3.6 - 11.0 K/uL   RBC 3.81 3.80 - 5.20 MIL/uL   Hemoglobin 9.5 (L) 12.0 - 16.0 g/dL   HCT 78.2 (L) 95.6 - 21.3 %   MCV 76.4 (L) 80.0 - 100.0 fL   MCH 24.9 (L) 26.0 - 34.0 pg   MCHC 32.6 32.0 - 36.0 g/dL   RDW 08.6 (H)  11.5 - 14.5 %   Platelets 147 (L) 150 - 440 K/uL   Neutrophils Relative % 77 %   Neutro Abs 6.1 1.4 - 6.5 K/uL   Lymphocytes Relative 15 %   Lymphs Abs 1.2 1.0 - 3.6 K/uL   Monocytes Relative 7 %   Monocytes Absolute 0.6 0.2 - 0.9 K/uL   Eosinophils Relative 1 %   Eosinophils Absolute 0.1 0 - 0.7 K/uL   Basophils Relative 0 %   Basophils Absolute 0.0 0 - 0.1 K/uL     Assessment   G3P1103 at 2023w0d Estimated Date of Delivery: 04/11/17  The fetus is reassuring.   Patient Active Problem List   Diagnosis Date Noted  . Irregular uterine contractions 03/27/2017  . Late prenatal care affecting pregnancy in second trimester 11/24/2016  . H/O cesarean section complicating pregnancy 11/24/2016  . Supervision of normal intrauterine pregnancy in multigravida in second trimester 11/24/2016  . History of postpartum hypertension 11/24/2016  . Short interval between pregnancies affecting pregnancy, antepartum 11/24/2016  . Hypertensive emergency 02/09/2016  . Accelerated  hypertension 02/08/2016    Plan  1. Admit to L&D :    2. EFM: -- Category 1 3. Admission labs  5. Repeat CD with tubal  Elonda Huskyavid J. Adrieana Fennelly, M.D. 04/04/2017 7:26 AM

## 2017-04-04 NOTE — Anesthesia Preprocedure Evaluation (Addendum)
Anesthesia Evaluation  Patient identified by MRN, date of birth, ID band Patient awake    Reviewed: Allergy & Precautions, NPO status , Patient's Chart, lab work & pertinent test results, reviewed documented beta blocker date and time   Airway Mallampati: II  TM Distance: >3 FB     Dental  (+) Chipped   Pulmonary former smoker,           Cardiovascular hypertension,      Neuro/Psych    GI/Hepatic   Endo/Other    Renal/GU      Musculoskeletal   Abdominal   Peds  Hematology  (+) anemia ,   Anesthesia Other Findings   Reproductive/Obstetrics                            Anesthesia Physical Anesthesia Plan  ASA: II  Anesthesia Plan: Spinal   Post-op Pain Management:    Induction: Intravenous  PONV Risk Score and Plan:   Airway Management Planned:   Additional Equipment:   Intra-op Plan:   Post-operative Plan:   Informed Consent: I have reviewed the patients History and Physical, chart, labs and discussed the procedure including the risks, benefits and alternatives for the proposed anesthesia with the patient or authorized representative who has indicated his/her understanding and acceptance.     Plan Discussed with: CRNA  Anesthesia Plan Comments:        Anesthesia Quick Evaluation

## 2017-04-04 NOTE — Anesthesia Procedure Notes (Signed)
Spinal  Patient location during procedure: OR Staffing Anesthesiologist: Lazara Grieser, MD Performed: anesthesiologist  Preanesthetic Checklist Completed: patient identified, site marked, surgical consent, pre-op evaluation, timeout performed, IV checked and risks and benefits discussed Spinal Block Patient position: sitting Prep: ChloraPrep Patient monitoring: heart rate, cardiac monitor, continuous pulse ox and blood pressure Approach: midline Location: L3-4 Injection technique: single-shot Needle Needle type: Pencil-Tip  Needle gauge: 25 G Needle length: 9 cm Assessment Sensory level: T10     

## 2017-04-04 NOTE — Anesthesia Post-op Follow-up Note (Signed)
Anesthesia QCDR form completed.        

## 2017-04-04 NOTE — Transfer of Care (Signed)
Immediate Anesthesia Transfer of Care Note  Patient: Laurie Petersen  Procedure(s) Performed: REPEAT CESAREAN SECTION WITH BILATERAL TUBAL LIGATION (Bilateral )  Patient Location: PACU  Anesthesia Type:Spinal  Level of Consciousness: awake, alert  and oriented  Airway & Oxygen Therapy: Patient Spontanous Breathing  Post-op Assessment: Report given to RN and Post -op Vital signs reviewed and stable  Post vital signs: Reviewed and stable  Last Vitals:  Vitals:   04/04/17 0737 04/04/17 0917  BP: (!) 142/95 140/87  Pulse: 62 (!) 56  Resp:  (!) 21  Temp:  (!) 36.3 C  SpO2:  99%    Last Pain:  Vitals:   04/04/17 0715  TempSrc: Oral  PainSc: 0-No pain         Complications: No apparent anesthesia complications

## 2017-04-04 NOTE — Op Note (Signed)
      OP NOTE  Date: 04/04/2017   9:09 AM Name Laurie SolianBrittany J Winberry MR# [id number]  Preoperative Diagnosis: 1. Intrauterine pregnancy at 2865w0d 2. Desires Permanent Sterilization Active Problems:   Cesarean delivery delivered  Postoperative Diagnosis: 1. Intrauterine pregnancy at 11065w0d, delivered 2. Desires Permanent Sterilization 3. Viable infant 4. Remainder same as pre-op  Procedure: 1. Repeat Low-Transverse Cesarean Section 2. Bilateral Tubal Occlusion  Surgeon: Elonda Huskyavid J. Lynsay Fesperman, MD  Assistant:    Anesthesia: Spinal   EBL: 700  ml    Findings: 1) female infant, Apgar scores of 9    at 1 minute and 9    at 5 minutes and a birthweight of 95.94  ounces.    2) Normal uterus, tubes and ovaries.   Procedure:   The patient was prepped and draped in the supine position and placed under spinal anesthesia.  A transverse incision was made across the abdomen in a Pfannenstiel manner. If indicated the old scar was systematically removed with sharp dissection.  We carried the dissection down to the level of the fascia.  The fascia was incised in a curvilinear manner.  The fascia was then elevated from the rectus muscles with blunt and sharp dissection.  The rectus muscles were separated laterally exposing the peritoneum.  The peritoneum was carefully entered with care being taken to avoid bowel and bladder.  A self-retaining retractor was placed.  The visceral peritoneum was incised in a curvilinear fashion across the lower uterine segment creating a bladder flap. A transverse incision was made across the lower uterine segment and extended laterally and superiorly using the bandage scissors.  Artificial rupture membranes was performed and Clear fluid was noted.  The infant was delivered from the breech position.  A nuchal cord was present. The cord was doubly clamped and cut. Cord blood was obtained if appropriate.  The infant was handed to the pediatric personnel  who then placed the infant  under heat lamps where it was cleaned dried and re-suctioned. The placenta was delivered. The hysterotomy incision was then identified on ring forceps.  The uterine cavity was cleaned with a moist lap sponge.  The hysterotomy incision was closed with a running interlocking suture of Vicryl.  Hemostasis was excellent.  Pitocin was run in the IV and the uterus was found to be firm. The fallopian tubes were identified  and followed out to their fine fimbriated ends and then back to the mid-portion of the tubeon which was elevated on a babcock clamp.  Both tubes were completely occluded using Filshie clips in a perpendicular manner.  Hemostasis was noted. The posterior cul-de-sac and gutters were cleaned and inspected.  Hemostasis was noted.  The fascia was then closed with a running suture of #1 Vicryl.  Hemostasis of the subcutaneous tissues was obtained using the Bovie.  The subcutaneous tissues were closed with a running suture of 000 Vicryl.  A subcuticular suture was placed.  Steri-Strips were applied in the usual manner.  A pressure dressing was placed.  The patient went to the recovery room in stable condition.   Elonda Huskyavid J. Uriel Dowding, M.D. 04/04/2017 9:09 AM

## 2017-04-04 NOTE — Lactation Note (Signed)
This note was copied from a baby's chart. Lactation Consultation Note  Patient Name: Laurie Petersen Today's Date: 04/04/2017 Reason for consult: Initial assessment;Difficult latch   Mom states she pumped and bottle fed her 246 yo son and 5510 month old twin girls who were born at 5436 weeks and 3 lbs for a few months each time. She planned on pumping and bottle feeding this baby Laurie Petersen too, thinking she would not BF well either. Laurie Petersen was rooting near breast, so MOm agreed to try breastfeeding. I see no issues with Mom's breast, nipples, or baby's mouth/suck, yet she was off/on the nipple during all BF attempts. She does have a very stuffy nose, so this could be the cause for now. Mom has an abundant colostrum supply with any compression around areola/breast. Laurie Petersen would suckle off/on for a minute getting some swallows, then come off and lick up the dripping colostrum. Due to Mom's history of poor latches and original plan to pump/bottle, we tried nipple shield to see if that would improve latch/milk transfer. We had same issue of off/on latches, but she did drink in 5 ml of expressed colostrum form shield. I taught mom hand expression, but she wanted me to express for her since she is post C/S. I hand expressed 15 ml from right and 10 ml from left breast in about 5 minutes each side. I encouraged Mom to let me help with the next 1-2 feedings to work on progress.  I also brought all pump equipment to Mom and let her know pros/cons of pump/ breastfeed but we support her decisions.    Maternal Data    Feeding Feeding Type: Breast Fed Length of feed: 2 min  LATCH Score Latch: Repeated attempts needed to sustain latch, nipple held in mouth throughout feeding, stimulation needed to elicit sucking reflex.(stuffy nose)  Audible Swallowing: A few with stimulation  Type of Nipple: Everted at rest and after stimulation  Comfort (Breast/Nipple): Soft / non-tender  Hold (Positioning): Assistance  needed to correctly position infant at breast and maintain latch.  LATCH Score: 7  Interventions Interventions: Breast feeding basics reviewed;Assisted with latch;Skin to skin;Breast massage;Pre-pump if needed;Adjust position;Support pillows;Position options;Expressed milk  Lactation Tools Discussed/Used Tools: Nipple Dorris CarnesShields   Consult Status Consult Status: Follow-up Date: 04/05/17    Laurie Petersen 04/04/2017, 11:32 AM

## 2017-04-05 LAB — RPR: RPR Ser Ql: NONREACTIVE

## 2017-04-05 NOTE — Anesthesia Post-op Follow-up Note (Signed)
  Anesthesia Pain Follow-up Note  Patient: Laurie Petersen  Day #: 1  Date of Follow-up: 04/05/2017 Time: 7:25 AM  Last Vitals:  Vitals:   04/05/17 0116 04/05/17 0428  BP: 126/79 137/78  Pulse: 61 (!) 58  Resp: 18 18  Temp: 36.8 C 36.8 C  SpO2: 100% 100%    Level of Consciousness: alert  Pain: none   Side Effects:None  Catheter Site Exam:clean, dry, no drainage     Plan: D/C from anesthesia care at surgeon's request  Karoline Caldwelleana Rosellen Lichtenberger

## 2017-04-05 NOTE — Progress Notes (Signed)
Patient ID: Laurie Petersen, female   DOB: [birth year], 27 y.o.   MRN: [id number]    Progress Note - Cesarean Delivery  Laurie Petersen is a 27 y.o. [phone] now PP day 1 s/p C-Section, Low Transverse .   Subjective:  Patient reports no problems with eating, bowel movements, voiding, or their wound  Objective:  Vital signs in last 24 hours: Temp:  [97.2 F (36.2 C)-98.4 F (36.9 C)] 97.9 F (36.6 C) (01/03 0742) Pulse Rate:  [48-63] 60 (01/03 0742) Resp:  [15-23] 18 (01/03 0742) BP: (113-143)/(69-97) 121/80 (01/03 0742) SpO2:  [97 %-100 %] 100 % (01/03 0742)  Physical Exam:  General: alert, cooperative and no distress Lochia: appropriate Uterine Fundus: firm Incision: dressing intact DVT Evaluation: No evidence of DVT seen on physical exam.    Data Review Recent Labs    04/04/17 0530  HGB 9.5*  HCT 29.1*    Assessment:  Active Problems:   Cesarean delivery delivered   Status post Cesarean section. Doing well postoperatively.     Plan:       Continue current care.    Elonda Huskyavid J. Evans, M.D. 04/05/2017 8:32 AM

## 2017-04-05 NOTE — Anesthesia Postprocedure Evaluation (Signed)
Anesthesia Post Note  Patient: Francella SolianBrittany J Reinard  Procedure(s) Performed: REPEAT CESAREAN SECTION WITH BILATERAL TUBAL LIGATION (Bilateral )  Patient location during evaluation: Mother Baby Anesthesia Type: Spinal Level of consciousness: awake, awake and alert and oriented Pain management: pain level controlled Vital Signs Assessment: post-procedure vital signs reviewed and stable Respiratory status: spontaneous breathing Cardiovascular status: blood pressure returned to baseline Postop Assessment: no headache, no backache, no apparent nausea or vomiting and adequate PO intake Anesthetic complications: no     Last Vitals:  Vitals:   04/05/17 0116 04/05/17 0428  BP: 126/79 137/78  Pulse: 61 (!) 58  Resp: 18 18  Temp: 36.8 C 36.8 C  SpO2: 100% 100%    Last Pain:  Vitals:   04/05/17 0643  TempSrc:   PainSc: 6                  Waddell Iten Lawerance CruelStarr

## 2017-04-06 MED ORDER — OXYCODONE HCL 5 MG PO TABS: 5.0000 mg | ORAL_TABLET | ORAL | 0 refills | Status: DC | PRN

## 2017-04-06 NOTE — Progress Notes (Signed)
Patient discharged home with infant and significant other. Discharge instructions, prescriptions, hygiene kit and follow up appointment given to and reviewed with patient and significant other. Patient verbalized understanding. Escorted out via wheelchair by auxiliary.  

## 2017-04-06 NOTE — Clinical Social Work Maternal (Signed)
  CLINICAL SOCIAL WORK MATERNAL/CHILD NOTE  Patient Details  Name: Laurie Petersen MRN: [id number] Date of Birth: 1990-06-03  Date:  04/06/2017  Clinical Social Worker Initiating Note:  Shela Leff MSW,LCSW Date/Time: Initiated:  04/06/17/      Child's Name:      Biological Parents:  Mother   Need for Interpreter:  None   Reason for Referral:  Other (Comment)(CSW received consult due to pediatrician liason stating that patient does not have custody of her other children)   Address:  [place] [address]    Phone number:  [phone] (home)     Additional phone number: none  Household Members/Support Persons (HM/SP):       HM/SP Name Relationship DOB or Age  HM/SP -1        HM/SP -2        HM/SP -3        HM/SP -4        HM/SP -5        HM/SP -6        HM/SP -7        HM/SP -8          Natural Supports (not living in the home):  Parent   Professional Supports:     Employment:     Type of Work:     Education:      Homebound arranged:    Museum/gallery curator Resources:  Medicaid   Other Resources:      Cultural/Religious Considerations Which May Impact Care:  none  Strengths:  Ability to meet basic needs , Compliance with medical plan , Home prepared for child    Psychotropic Medications:         Pediatrician:       Pediatrician List:   [place]      Pediatrician Fax Number:    Risk Factors/Current Problems:  None   Cognitive State:  Alert , Able to Concentrate    Mood/Affect:  Happy    CSW Assessment: CSW consulted due to the pediatric liaison stating that the pediatrician's records show patient does not have custody of her other children. CSW met with patient this afternoon, who came out of getting ready to take her shower to speak with me. I offered to return but she stated she would speak with me at that time. Patient was  very pleasant and when I told her the reason why I was consulted she had an expression of surprise and stated that she has custody of all three of her children and all three of her children live with her. She states her mother does not live with her because she has her own place. Patient reports that she had to go to [place] for a short period of time and that she designated her mother as the "temporary guardian" so that she could take her children to doctor's appointments, etc while she was gone.   Patient tells CSW that she has all necessities for her newborn. She stated that the staff provided her a pack and play. She has no concerns and staff reports she is caring and bonding well with baby.   CSW Plan/Description:  No Further Intervention Required/No Barriers to Discharge    Shela Leff, LCSW 04/06/2017, 2:14 PM

## 2017-04-06 NOTE — Discharge Summary (Signed)
    Physician Obstetric Discharge Summary  Patient ID: Laurie SolianBrittany J Allemand MRN: [id number] DOB/AGE: 1990-09-26 27 y.o.   Date of Admission: 04/04/2017  Date of Discharge:   Admitting Diagnosis: Scheduled cesarean section at 7451w2d - Tubal occlusion  Mode of Delivery: repeat cesarean section            Discharge Diagnosis: No other diagnosis   Intrapartum Procedures:    Post partum procedures:   Complications: none                        Discharge Day SOAP Note:  Subjective:  The patient has no complaints.  She is ambulating well. She is taking PO well. Pain is well controlled with current medications. Patient is urinating without difficulty.   She is passing flatus.    Objective  Vital signs in last 24 hours: BP 132/84 (BP Location: Left Arm)   Pulse (!) 58   Temp 98.9 F (37.2 C) (Oral)   Resp 18   Ht 5\' 6"  (1.676 m)   Wt 171 lb (77.6 kg)   LMP 07/13/2016 (Exact Date)   SpO2 96%   BMI 27.60 kg/m   Physical Exam: Gen: NAD Abdomen:  clean, dry, no drainage, healing Fundus Fundal Tone: Firm  Lochia Amount: Small     Data Review Labs: CBC Latest Ref Rng & Units 04/04/2017 01/31/2017 10/25/2016  WBC 3.6 - 11.0 K/uL 8.0 7.4 7.9  Hemoglobin 12.0 - 16.0 g/dL 4.0(J9.5(L) 10.4(L) 12.1  Hematocrit 35.0 - 47.0 % 29.1(L) 31.2(L) 34.5  Platelets 150 - 440 K/uL 147(L) 182 -   O POS  Assessment:  Active Problems:   Cesarean delivery delivered   Doing well.  Normal progress as expected.    Plan:  Discharge to home  Modified rest as directed - may slowly resume normal activities with restrictions  as discussed.  Medications as written.  See below for additional.       Discharge Instructions: Per After Visit Summary. Activity: Advance as tolerated. Pelvic rest for 6 weeks.  Also refer to After Visit Summary.  Wound care discussed. Diet: Regular Medications: Allergies as of 04/06/2017   No Known Allergies     Medication List    TAKE these medications     CITRANATAL BLOOM 90-1 MG Tabs Take 90 mg by mouth daily. What changed:  when to take this   oxyCODONE 5 MG immediate release tablet Commonly known as:  Oxy IR/ROXICODONE Take 1 tablet (5 mg total) by mouth every 4 (four) hours as needed for severe pain.      Outpatient follow up:  Follow-up Information    Linzie CollinEvans, David James, MD Follow up in 1 week(s).   Specialty:  Obstetrics and Gynecology Contact information: [address] [place] [address] [phone]          Postpartum contraception: Will discuss at first post-partum visit.  Discharged Condition: good  Discharged to: home  Newborn Data: Disposition:home with mother  Apgars: APGAR (1 MIN): 9   APGAR (5 MINS): 9   APGAR (10 MINS):     Elonda Huskyavid J. Evans, M.D. 04/06/2017 8:22 AM

## 2017-04-09 ENCOUNTER — Other Ambulatory Visit: Payer: Self-pay

## 2017-04-09 ENCOUNTER — Ambulatory Visit (INDEPENDENT_AMBULATORY_CARE_PROVIDER_SITE_OTHER): Payer: Medicaid Other | Admitting: Obstetrics and Gynecology

## 2017-04-09 ENCOUNTER — Encounter: Payer: Self-pay | Admitting: Surgery

## 2017-04-09 ENCOUNTER — Emergency Department
Admission: EM | Admit: 2017-04-09 | Discharge: 2017-04-09 | Disposition: A | Discharge status: Home / Self Care | Payer: Medicaid Other | Attending: Emergency Medicine | Admitting: Emergency Medicine

## 2017-04-09 ENCOUNTER — Ambulatory Visit (INDEPENDENT_AMBULATORY_CARE_PROVIDER_SITE_OTHER): Payer: Medicaid Other | Admitting: Surgery

## 2017-04-09 ENCOUNTER — Encounter: Payer: Self-pay | Admitting: Obstetrics and Gynecology

## 2017-04-09 VITALS — BP 156/90 | HR 54 | Temp 98.0°F

## 2017-04-09 DIAGNOSIS — K649 Unspecified hemorrhoids: Secondary | ICD-10-CM | POA: Diagnosis not present

## 2017-04-09 DIAGNOSIS — Z79899 Other long term (current) drug therapy: Secondary | ICD-10-CM | POA: Diagnosis not present

## 2017-04-09 DIAGNOSIS — K644 Residual hemorrhoidal skin tags: Secondary | ICD-10-CM

## 2017-04-09 DIAGNOSIS — Z87891 Personal history of nicotine dependence: Secondary | ICD-10-CM | POA: Insufficient documentation

## 2017-04-09 DIAGNOSIS — O872 Hemorrhoids in the puerperium: Secondary | ICD-10-CM

## 2017-04-09 DIAGNOSIS — B001 Herpesviral vesicular dermatitis: Secondary | ICD-10-CM

## 2017-04-09 DIAGNOSIS — B009 Herpesviral infection, unspecified: Secondary | ICD-10-CM

## 2017-04-09 DIAGNOSIS — K6289 Other specified diseases of anus and rectum: Secondary | ICD-10-CM | POA: Diagnosis present

## 2017-04-09 DIAGNOSIS — K645 Perianal venous thrombosis: Secondary | ICD-10-CM

## 2017-04-09 MED ORDER — PHENYLEPHRINE IN HARD FAT 0.25 % RE SUPP: 1.0000 | Freq: Two times a day (BID) | RECTAL | 0 refills | Status: DC

## 2017-04-09 MED ORDER — POLYETHYLENE GLYCOL 3350 17 G PO PACK: 17.0000 g | PACK | Freq: Every day | ORAL | 0 refills | Status: DC

## 2017-04-09 MED ORDER — HYDROCORTISONE ACETATE 25 MG RE SUPP: 25.0000 mg | Freq: Two times a day (BID) | RECTAL | 0 refills | Status: DC

## 2017-04-09 MED ORDER — DOCUSATE SODIUM 100 MG PO CAPS: 100.0000 mg | ORAL_CAPSULE | Freq: Two times a day (BID) | ORAL | 0 refills | Status: AC

## 2017-04-09 MED ORDER — LIDOCAINE VISCOUS 2 % MT SOLN
15.0000 mL | Freq: Once | OROMUCOSAL | Status: AC
  Administered 2017-04-09: 15 mL via OROMUCOSAL
  Filled 2017-04-09: qty 15

## 2017-04-09 MED ORDER — FAMCICLOVIR 500 MG PO TABS: 1000.0000 mg | ORAL_TABLET | Freq: Every day | ORAL | 0 refills | Status: AC

## 2017-04-09 MED ORDER — ACYCLOVIR 400 MG PO TABS: 400.0000 mg | ORAL_TABLET | Freq: Three times a day (TID) | ORAL | 0 refills | Status: AC

## 2017-04-09 MED ORDER — OXYCODONE HCL 5 MG PO TABS: 5.0000 mg | ORAL_TABLET | ORAL | 0 refills | Status: DC | PRN

## 2017-04-09 NOTE — Progress Notes (Signed)
04/09/2017  Reason for Visit:  Painful hemorrhoids  Referring Provider:  Brennan Bailey, MD  History of Present Illness: Laurie Petersen is a 27 y.o. female referred by Dr. Logan Bores for evaluation of painful hemorrhoids.  Patient reports that yesterday she started having worsening perianal pain that has become very significant to the point that it hurts to sit down.  She has had issues with hemorrhoids in the past and described having hemorrhoid issues while she was pregnant.  She just had a baby via C-section on 04/04/17.  She was seen by Dr. Logan Bores today and he referred her to [place] for further evaluation.  As mentioned today is day 2 of her hemorrhoidal pain.  She denies having any blood in her stool but does report having some constipation.  She has been taking oxycodone for pain control after her C-section.  She denies having any drainage on her underwear from the perianal area but does wear pads after her delivery.  She presented to the emergency room earlier this morning she was given a prescription for Anusol, MiraLAX, and instructed how to do sitz bath as well.  Past Medical History: Past Medical History:  Diagnosis Date  . Anemia   . 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 CESAREAN SECTION    . CESAREAN SECTION WITH BILATERAL TUBAL LIGATION Bilateral 04/04/2017   Procedure: REPEAT CESAREAN SECTION WITH BILATERAL TUBAL LIGATION;  Surgeon: Linzie Collin, MD;  Location: [facility] ORS;  Service: Obstetrics;  Laterality: Bilateral;  . TONSILLECTOMY      Home Medications: Prior to Admission medications   Medication Sig Start Date End Date Taking? Authorizing Provider  acyclovir (ZOVIRAX) 400 MG tablet Take 1 tablet (400 mg total) by mouth 3 (three) times daily for 7 days. Patient not taking: Reported on 04/09/2017 04/09/17 04/16/17  Rebecka Apley, MD  docusate sodium (COLACE) 100 MG capsule Take 1 capsule (100 mg total) by  mouth 2 (two) times daily for 14 days. Patient not taking: Reported on 04/09/2017 04/09/17 04/23/17  Rebecka Apley, MD  famciclovir [facility]) 500 MG tablet Take 2 tablets (1,000 mg total) by mouth daily for 1 day. Take 3 tablets at once 04/09/17 04/10/17  Linzie Collin, MD  hydrocortisone (ANUSOL-HC) 25 MG suppository Place 1 suppository (25 mg total) rectally every 12 (twelve) hours. Patient not taking: Reported on 04/09/2017 04/09/17 04/09/18  Rebecka Apley, MD  oxyCODONE (OXY IR/ROXICODONE) 5 MG immediate release tablet Take 1 tablet (5 mg total) by mouth every 4 (four) hours as needed for severe pain. 04/06/17   Linzie Collin, MD  oxyCODONE (ROXICODONE) 5 MG immediate release tablet Take 1 tablet (5 mg total) by mouth every 4 (four) hours as needed for severe pain. 04/09/17   Kalecia Hartney, Elita Quick, MD  phenylephrine (,USE FOR PREPARATION-H,) 0.25 % suppository Place 1 suppository rectally 2 (two) times daily. 04/09/17   Rebecka Apley, MD  polyethylene glycol [facility]) packet Take 17 g by mouth daily. Patient not taking: Reported on 04/09/2017 04/09/17   Rebecka Apley, MD  Prenatal-DSS-FeCb-FeGl-FA [facility] BLOOM) 90-1 MG TABS Take 90 mg by mouth daily. Patient not taking: Reported on 04/09/2017 11/22/16   Hildred Laser, MD    Allergies: No Known Allergies  Social History:  reports that she quit smoking about 8 months ago. Her smoking use included cigarettes. She smoked 0.50 packs per day. she has never used smokeless tobacco. She reports that she  does not drink alcohol or use drugs.   Family History: Family History  Problem Relation Age of Onset  . Arthritis Maternal Grandmother   . Diabetes Maternal Grandmother     Review of Systems: Review of Systems  Constitutional: Negative for chills and fever.  HENT: Negative for hearing loss.   Respiratory: Negative for shortness of breath.   Cardiovascular: Negative for chest pain.  Gastrointestinal: Positive for abdominal pain and  constipation. Negative for blood in stool, nausea and vomiting.       Perianal pain from hemorrhoids  Genitourinary: Negative for dysuria.  Musculoskeletal: Negative for myalgias.  Skin: Negative for rash.  Neurological: Negative for dizziness.  Psychiatric/Behavioral: Negative for depression.    Physical Exam Temperature 98.6, BP 143/99, HR 66, Weight 156 lbs CONSTITUTIONAL: No acute distress, well-nourished HEENT:  Normocephalic, atraumatic, extraocular motion intact. NECK: Trachea is midline, and there is no jugular venous distension. RESPIRATORY:  Lungs are clear, and breath sounds are equal bilaterally. Normal respiratory effort without pathologic use of accessory muscles. CARDIOVASCULAR: Heart is regular without murmurs, gallops, or rubs. GI: The abdomen is soft, nondistended, nontender.   RECTAL: On external exam, patient has enlarged hemorrhoidal columns externally with no evidence of thrombosis but significant inflammation and swelling.  These are tender to palpation.  Digital exam deferred at this point due to pain.  No gross blood noted. MUSCULOSKELETAL:  Normal muscle strength and tone in all four extremities.  No peripheral edema or cyanosis. SKIN: Skin turgor is normal. There are no pathologic skin lesions.  NEUROLOGIC:  Motor and sensation is grossly normal.  Cranial nerves are grossly intact. PSYCH:  Alert and oriented to person, place and time. Affect is normal.  Laboratory Analysis: Labs from 04/04/17 show a WBC of 8.0, hemoglobin 9.5, hematocrit 29.1, platelets 147  Imaging: None recently  Assessment and Plan: This is a 27 y.o. female who presents with inflamed external hemorrhoids.  There is no evidence of thrombosis that would warrant incision at this point.  Discussed with the patient that given this degree of inflammation making any procedures would only make things worse.  Agree with the current treatment plan from the emergency department and discussed with the  patient that she should do MiraLAX once daily, increase her fiber intake, do sitz bath's, continue the Anusol as prescribed, and can also take Advil for pain control.  We will give her a short prescription for oxycodone 5 mg tablets x10 for breakthrough pain.  She will follow-up in 1 week to evaluate her progress.  Understands this plan and all of her questions have been answered.  Face-to-face time spent with the patient and care providers was 60 minutes, with more than 50% of the time spent counseling, educating, and coordinating care of the patient.     Howie IllJose Luis Sonali Wivell, MD [facility]

## 2017-04-09 NOTE — Patient Instructions (Addendum)
Please do not use Colace.  Please start using the Miralax 17 grams daily for constipation.  Please start doing sitz baths to help with the inflammation.  For pain please start taking Ibuprofen (Advil, Motrin).   We will see you back in one week to make sure that you are doing well. If you are doing better please call us back to cancel your appointment.   Disposable Sitz Bath A disposable sitz bath is a plastic basin that fits over the toilet. A bag is hung above the toilet, and the bag is connected to a tube that opens into the basin. The bag is filled with warm water that flows into the basin through the tube. A sitz bath can be used to help relieve symptoms, clean, and promote healing in the genital and anal areas, as well as in the lower abdomen and buttocks. What are the risks? Sitz baths are generally very safe. It is possible for the skin between the genitals and the anus (perineum) to become infected, but this is rare. You can avoid this by cleaning your sitz bath supplies thoroughly. How to use a disposable sitz bath 1. Close the clamp on the tube. Make sure the clamp is closed tightly to prevent leakage. 2. Fill the sitz bath basin and the plastic bag with warm water. The water should be warm enough to be comfortable, but not hot. 3. Raise the toilet seat and place the filled basin on the toilet. Make sure the overflow opening is facing toward the back of the toilet. ? If you prefer, you may place the empty basin on the toilet first, and then use the plastic bag to fill the basin with warm water. 4. Hang the filled plastic bag overhead on a hook or towel rack close to the toilet. The bag should be higher than the toilet so that the water will flow down through the tube. 5. Attach the tube to the opening on the basin. Make sure that the tube is attached to the basin tightly to prevent leakage. 6. Sit on the basin and release the clamp. This will allow warm water to flow into the basin  and flush the area around your genitals and anus. 7. Remain sitting on the basin for about 15-20 minutes, or as long as told by your health care provider. 8. Stand up and gently pat your skin dry. If directed, apply clean bandages (dressings) to the affected area as told by your health care provider. 9. Carefully remove the basin from the toilet seat and tip the basin into the toilet to empty any remaining water. Empty any remaining water from the plastic bag into the toilet. Then, flush the toilet. 10. Wash the basin with warm water and soap. Let the basin air dry in the sink. You should also let the plastic bag and the tubing air dry. 11. Store the basin, tubing, and plastic bag in a clean, dry area. 12. Wash your hands with soap and water. If soap and water are not available, use hand sanitizer. Contact a health care provider if:  You have symptoms that get worse instead of better.  You develop new skin irritation, redness, or swelling around your genitals or anus. This information is not intended to replace advice given to you by your health care provider. Make sure you discuss any questions you have with your health care provider. Document Released: 09/19/2011 Document Revised: 08/26/2015 Document Reviewed: 02/07/2015 Elsevier Interactive Patient Education  [employer]2018 [employer]

## 2017-04-09 NOTE — ED Notes (Signed)
Pt to [place] with boyfriend to apply lidocaine herself; this nurse stayed with baby; mother appreciative

## 2017-04-09 NOTE — ED Notes (Signed)
Pt's significant other up to desk to ask about wait time, to see if they've "been forgotten"; understand staff knows they are waiting in the family for treatment room; into family WR to check on pt; pt tearful; rates rectal pain 10/10; order obtained from Dr Dolores FrameSung

## 2017-04-09 NOTE — Progress Notes (Unsigned)
amb  

## 2017-04-09 NOTE — ED Notes (Signed)
Patient reports delivered by c-section 2 days ago.  States having hemorrhoid prior to delivery now it is worse.  Reports having hard stool with bowel movement.  Patient reports being unable to sit or get in comfortable position and having no sleep.  Patient also concerned about rash around her mouth.

## 2017-04-09 NOTE — ED Triage Notes (Signed)
Patient reports 2 days postpartum (c-section).  Reports having hemorrhoids and rash around her mouth.

## 2017-04-09 NOTE — ED Triage Notes (Signed)
Patient updated on wait time. Patient verbalized understanding.

## 2017-04-09 NOTE — Discharge Instructions (Signed)
Please ensure that you are doing a sitz bath about 3-4 times a day.  Please make sure you are drinking lots of water and eating fruits and vegetables.  Please try not to strain when having a bowel movement.  Please use the medications to help shrink her hemorrhoids.  Please follow-up with your primary care physician.

## 2017-04-09 NOTE — ED Notes (Signed)
Patient given instructions regarding sitz bath, encouraged patient to try for pain relief.

## 2017-04-09 NOTE — ED Provider Notes (Signed)
[facility] Emergency Department Provider Note   ____________________________________________   First MD Initiated Contact with Patient 04/09/17 [phone]     (approximate)  I have reviewed the triage vital signs and the nursing notes.   HISTORY  Chief Complaint Hemorrhoids    HPI Laurie Petersen is a 27 y.o. female who comes into the hospital today with hemorrhoids.  The patient recently gave birth via C-section and has been having bad hemorrhoids.  She has been using Preparation H but is not helping her.  She states that it got to the point where it is really big and she has lots of them.  She has not been able to sleep and she is crying.  She states that she is in a lot of pain.  She also has a rash around her mouth that is concerning.  The patient has been taking oxycodone.  She had a bowel movement yesterday that she reports she had a pushing force out.  The patient has not done any sits baths.  She is unable to tolerate the pain so she is here tonight.  She states that her pain is a 10 out of 10 in intensity.   Past Medical History:  Diagnosis Date  . Anemia   . Postpartum hypertension 01/2016    Patient Active Problem List   Diagnosis Date Noted  . Cesarean delivery delivered 04/04/2017  . Irregular uterine contractions 03/27/2017  . Late prenatal care affecting pregnancy in second trimester 11/24/2016  . H/O cesarean section complicating pregnancy 11/24/2016  . Supervision of normal intrauterine pregnancy in multigravida in second trimester 11/24/2016  . History of postpartum hypertension 11/24/2016  . Short interval between pregnancies affecting pregnancy, antepartum 11/24/2016  . Hypertensive emergency 02/09/2016  . Accelerated hypertension 02/08/2016    Past Surgical History:  Procedure Laterality Date  . CESAREAN SECTION    . CESAREAN SECTION WITH BILATERAL TUBAL LIGATION Bilateral 04/04/2017   Procedure: REPEAT CESAREAN SECTION WITH  BILATERAL TUBAL LIGATION;  Surgeon: Linzie CollinEvans, David James, MD;  Location: [facility] ORS;  Service: Obstetrics;  Laterality: Bilateral;  . TONSILLECTOMY      Prior to Admission medications   Medication Sig Start Date End Date Taking? Authorizing Provider  acyclovir (ZOVIRAX) 400 MG tablet Take 1 tablet (400 mg total) by mouth 3 (three) times daily for 7 days. 04/09/17 04/16/17  Rebecka ApleyWebster, Owen Pratte P, MD  docusate sodium (COLACE) 100 MG capsule Take 1 capsule (100 mg total) by mouth 2 (two) times daily for 14 days. 04/09/17 04/23/17  Rebecka ApleyWebster, Graylon Amory P, MD  hydrocortisone (ANUSOL-HC) 25 MG suppository Place 1 suppository (25 mg total) rectally every 12 (twelve) hours. 04/09/17 04/09/18  Rebecka ApleyWebster, Kimaria Struthers P, MD  oxyCODONE (OXY IR/ROXICODONE) 5 MG immediate release tablet Take 1 tablet (5 mg total) by mouth every 4 (four) hours as needed for severe pain. 04/06/17   Linzie CollinEvans, David James, MD  phenylephrine (,USE FOR PREPARATION-H,) 0.25 % suppository Place 1 suppository rectally 2 (two) times daily. 04/09/17   Rebecka ApleyWebster, Durwood Dittus P, MD  polyethylene glycol [facility](MIRALAX) packet Take 17 g by mouth daily. 04/09/17   Rebecka ApleyWebster, Caryn Gienger P, MD  Prenatal-DSS-FeCb-FeGl-FA (CITRANATAL BLOOM) 90-1 MG TABS Take 90 mg by mouth daily. Patient taking differently: Take 90 mg by mouth 3 (three) times daily.  11/22/16   Hildred Laserherry, [name], MD    Allergies Patient has no known allergies.  Family History  Problem Relation Age of Onset  . Arthritis Maternal Grandmother   . Diabetes Maternal Grandmother  Social History Social History   Tobacco Use  . Smoking status: Former Smoker    Packs/day: 0.50    Types: Cigarettes    Last attempt to quit: 08/2016    Years since quitting: 0.6  . Smokeless tobacco: Never Used  Substance Use Topics  . Alcohol use: No  . Drug use: No    Review of Systems  Constitutional: No fever/chills Eyes: No visual changes. ENT: No sore throat. Cardiovascular: Denies chest pain. Respiratory: Denies shortness of  breath. Gastrointestinal: No abdominal pain.  No nausea, no vomiting.  No diarrhea.  No constipation. Genitourinary: Hemorrhoids Musculoskeletal: Negative for back pain. Skin: Negative for rash. Neurological: Negative for headaches, focal weakness or numbness.   ____________________________________________   PHYSICAL EXAM:  VITAL SIGNS: ED Triage Vitals [04/09/17 0208]  Enc Vitals Group     BP (!) 130/94     Pulse Rate 75     Resp 20     Temp 98.2 F (36.8 C)     Temp src      SpO2 99 %     Weight      Height      Head Circumference      Peak Flow      Pain Score 10     Pain Loc      Pain Edu?      Excl. in GC?     Constitutional: Alert and oriented. Well appearing and in significant distress. Eyes: Conjunctivae are normal. PERRL. EOMI. Head: Atraumatic. Nose: No congestion/rhinnorhea. Mouth/Throat: Vesicles noted to patient's right upper lip and left lower lip mucous membranes are moist.  Oropharynx non-erythematous. Cardiovascular: Normal rate, regular rhythm. Grossly normal heart sounds.  Good peripheral circulation. Respiratory: Normal respiratory effort.  No retractions. Lungs CTAB. Gastrointestinal: Soft and nontender. No distention.  Genitourinary: Large cluster of hemorrhoids none of which are thrombosed but tender to palpation. Musculoskeletal: No lower extremity tenderness nor edema.   Neurologic:  Normal speech and language.  Skin: C-section scar intact with no drainage Psychiatric: Mood and affect are normal.   ____________________________________________   LABS (all labs ordered are listed, but only abnormal results are displayed)  Labs Reviewed - No data to display ____________________________________________  EKG  None ____________________________________________  RADIOLOGY  No results found.  ____________________________________________   PROCEDURES  Procedure(s) performed: None  Procedures  Critical Care performed:  No  ____________________________________________   INITIAL IMPRESSION / ASSESSMENT AND PLAN / ED COURSE  As part of my medical decision making, I reviewed the following data within the electronic MEDICAL RECORD NUMBER Notes from prior ED visits and [place] Controlled Substance Database   This is a 27 year old female who comes in today with hemorrhoids.  The patient gave birth multiple days ago via C-section.  The patient did have some viscous lidocaine placed in her rectum when she initially arrived but she said it did not hurt.  We did place a sitz bath for the patient to sit and to help soothe some of her pain.  I did inform the patient that I did not think giving her any narcotic pain medication was appropriate.  The patient was frustrated that I could not give her anything else for pain.  I did inform the nurse that she could place the viscous lidocaine back to the patient's rectum after the sitz bath but the patient was upset and decided that she wanted to go.  I did inform the patient that she needs to take some stool softeners and MiraLAX as  well as lots of water fruits and vegetables to help with the constipation as well as the hemorrhoids.  The patient should follow back up with her primary care physician.  She will be discharged home.  The rash on the patient's lip does have the appearance of herpes labialis.  The patient states that it was tingling yesterday and started with the rash today. I will give her some acyclovir for treatment.       ____________________________________________   FINAL CLINICAL IMPRESSION(S) / ED DIAGNOSES  Final diagnoses:  Hemorrhoids, postpartum  Herpes labialis     ED Discharge Orders        Ordered    docusate sodium (COLACE) 100 MG capsule  2 times daily     04/09/17 0716    polyethylene glycol (MIRALAX) packet  Daily     04/09/17 0716    hydrocortisone (ANUSOL-HC) 25 MG suppository  Every 12 hours     04/09/17 0716    phenylephrine (,USE FOR  PREPARATION-H,) 0.25 % suppository  2 times daily     04/09/17 0716    acyclovir (ZOVIRAX) 400 MG tablet  3 times daily     04/09/17 2956       Note:  This document was prepared using Dragon voice recognition software and may include unintentional dictation errors.    Rebecka Apley, MD 04/09/17 [phone]

## 2017-04-09 NOTE — Progress Notes (Signed)
HPI:      [name] is a 27 y.o. [phone] who LMP was No LMP recorded.  Subjective:   She presents today after being seen in the emergency department for severe rectal pain from hemorrhoids.  They gave her a prescription for Anusol.  Patient presented here in significant pain and tears.  She has stated she has not been able to sleep all night. She also reports significant oral pain around her lips from blister outbreak. She reports that her cesarean delivery is not a problem she is having no pain from her incision.    Hx: The following portions of the patient's history were reviewed and updated as appropriate:             She  has a past medical history of Anemia and Postpartum hypertension (01/2016). She does not have any pertinent problems on file. She  has a past surgical history that includes Tonsillectomy; Cesarean section; and Cesarean section with bilateral tubal ligation (Bilateral, 04/04/2017). Her family history includes Arthritis in her maternal grandmother; Diabetes in her maternal grandmother. She  reports that she quit smoking about 8 months ago. Her smoking use included cigarettes. She smoked 0.50 packs per day. she has never used smokeless tobacco. She reports that she does not drink alcohol or use drugs. She has No Known Allergies.       Review of Systems:  Review of Systems  Constitutional: Denied constitutional symptoms, night sweats, recent illness, fatigue, fever, insomnia and weight loss.  Eyes: Denied eye symptoms, eye pain, photophobia, vision change and visual disturbance.  Ears/Nose/Throat/Neck: Denied ear, nose, throat or neck symptoms, hearing loss, nasal discharge, sinus congestion and sore throat.  Cardiovascular: Denied cardiovascular symptoms, arrhythmia, chest pain/pressure, edema, exercise intolerance, orthopnea and palpitations.  Respiratory: Denied pulmonary symptoms, asthma, pleuritic pain, productive sputum, cough, dyspnea and wheezing.   Gastrointestinal: Denied, gastro-esophageal reflux, melena, nausea and vomiting.  Genitourinary: See HPI for additional information.  Musculoskeletal: Denied musculoskeletal symptoms, stiffness, swelling, muscle weakness and myalgia.  Dermatologic: Denied dermatology symptoms, rash and scar.  Neurologic: See HPI for additional information.  Psychiatric: Denied psychiatric symptoms, anxiety and depression.  Endocrine: Denied endocrine symptoms including hot flashes and night sweats.   Meds:   Current Outpatient Medications on File Prior to Visit  Medication Sig Dispense Refill  . oxyCODONE (OXY IR/ROXICODONE) 5 MG immediate release tablet Take 1 tablet (5 mg total) by mouth every 4 (four) hours as needed for severe pain. 25 tablet 0  . phenylephrine (,USE FOR PREPARATION-H,) 0.25 % suppository Place 1 suppository rectally 2 (two) times daily. 12 suppository 0  . acyclovir (ZOVIRAX) 400 MG tablet Take 1 tablet (400 mg total) by mouth 3 (three) times daily for 7 days. (Patient not taking: Reported on 04/09/2017) 21 tablet 0  . docusate sodium (COLACE) 100 MG capsule Take 1 capsule (100 mg total) by mouth 2 (two) times daily for 14 days. (Patient not taking: Reported on 04/09/2017) 30 capsule 0  . hydrocortisone (ANUSOL-HC) 25 MG suppository Place 1 suppository (25 mg total) rectally every 12 (twelve) hours. (Patient not taking: Reported on 04/09/2017) 12 suppository 0  . polyethylene glycol (MIRALAX) packet Take 17 g by mouth daily. (Patient not taking: Reported on 04/09/2017) 14 each 0  . Prenatal-DSS-FeCb-FeGl-FA (CITRANATAL BLOOM) 90-1 MG TABS Take 90 mg by mouth daily. (Patient not taking: Reported on 04/09/2017) 30 tablet 11   No current facility-administered medications on file prior to visit.     Objective:  Vitals:   04/09/17 0829  BP: (!) 156/90  Pulse: (!) 54  Temp: 98 F (36.7 C)              Multiple herpetic type lesions (oral) around lips.  Face generally  swollen.   Assessment:    [id number] Patient Active Problem List   Diagnosis Date Noted  . Cesarean delivery delivered 04/04/2017  . Irregular uterine contractions 03/27/2017  . Late prenatal care affecting pregnancy in second trimester 11/24/2016  . H/O cesarean section complicating pregnancy 11/24/2016  . Supervision of normal intrauterine pregnancy in multigravida in second trimester 11/24/2016  . History of postpartum hypertension 11/24/2016  . Short interval between pregnancies affecting pregnancy, antepartum 11/24/2016  . Hypertensive emergency 02/09/2016  . Accelerated hypertension 02/08/2016     1. HSV (herpes simplex virus) infection   2. Hemorrhoids, unspecified hemorrhoid type     Likely thrombosed hemorrhoid based on pain and short history.   Plan:            1.  Antiviral for oral HSV prescribed.  2.  Immediatereferral to GI for possible hemorrhoidal management/banding. Orders No orders of the defined types were placed in this encounter.    Meds ordered this encounter  Medications  . famciclovir (FAMVIR) 500 MG tablet    Sig: Take 2 tablets (1,000 mg total) by mouth daily for 1 day. Take 3 tablets at once    Dispense:  3 tablet    Refill:  0      F/U  Return in about 1 week (around 04/16/2017). I spent 18 minutes with this patient of which greater than 50% was spent discussing hemorrhoids, and HSV.  A large amount of time was spent managing referrals and transfer  Of care for her hemorrhoids.  Elonda Huskyavid J. Evans, M.D. 04/09/2017 9:36 AM

## 2017-04-12 ENCOUNTER — Encounter: Payer: Medicaid Other | Admitting: Obstetrics and Gynecology

## 2017-04-16 ENCOUNTER — Other Ambulatory Visit: Payer: Self-pay

## 2017-04-17 ENCOUNTER — Telehealth: Payer: Self-pay

## 2017-04-17 ENCOUNTER — Telehealth: Payer: Self-pay | Admitting: Obstetrics and Gynecology

## 2017-04-17 ENCOUNTER — Other Ambulatory Visit: Payer: Self-pay | Admitting: Obstetrics and Gynecology

## 2017-04-17 ENCOUNTER — Encounter: Payer: Self-pay | Admitting: Obstetrics and Gynecology

## 2017-04-17 NOTE — Telephone Encounter (Signed)
The patient called and stated that she fell on her incision check and injured herself, the incision now has knots on it and is swelling, and causing the patient discomfort and pain. The patient would like to speak with a nurse, No other information was disclosed. Please advise.

## 2017-04-17 NOTE — Telephone Encounter (Signed)
Spoke with pt- she states she fell off the bed trying to kill an insect injuring her abdomen near her incision. Denies bleeding or oozing from incision. C/o discomfort and swelling. Encouraged to use ice and tylenol/ibuprofen. Also to report back if anything changes.

## 2017-04-18 ENCOUNTER — Ambulatory Visit: Payer: Self-pay | Admitting: Surgery

## 2017-04-19 ENCOUNTER — Telehealth: Payer: Self-pay | Admitting: General Practice

## 2017-04-19 NOTE — Telephone Encounter (Signed)
Left a message for the patient to call the office, patient no showed an appointment. If patient calls back please r/s appointment.

## 2017-04-24 ENCOUNTER — Encounter: Payer: Self-pay | Admitting: General Practice

## 2017-04-24 NOTE — Telephone Encounter (Signed)
Left another message for the patient to call the office, also I have mailed a letter to the patient to call the office and r/s missed appointment.

## 2017-05-02 ENCOUNTER — Telehealth: Payer: Self-pay | Admitting: Obstetrics and Gynecology

## 2017-05-02 NOTE — Telephone Encounter (Signed)
Pt fmla completed, signed and faxed with confirmation.

## 2017-05-02 NOTE — Telephone Encounter (Signed)
The patient called and stated that she missed a call from Crystal and she would like to have a call back. No other information was disclosed Please advise.

## 2017-05-21 ENCOUNTER — Encounter: Payer: Medicaid Other | Admitting: Obstetrics and Gynecology

## 2017-05-29 ENCOUNTER — Encounter: Payer: Self-pay | Admitting: Obstetrics and Gynecology

## 2017-05-29 ENCOUNTER — Ambulatory Visit (INDEPENDENT_AMBULATORY_CARE_PROVIDER_SITE_OTHER): Payer: Medicaid Other | Admitting: Obstetrics and Gynecology

## 2017-05-29 DIAGNOSIS — Z9851 Tubal ligation status: Secondary | ICD-10-CM

## 2017-05-29 DIAGNOSIS — Z98891 History of uterine scar from previous surgery: Secondary | ICD-10-CM

## 2017-05-29 DIAGNOSIS — O9081 Anemia of the puerperium: Secondary | ICD-10-CM

## 2017-05-29 NOTE — Progress Notes (Signed)
Pt is doing well. Pt c section feels hard.

## 2017-05-29 NOTE — Progress Notes (Signed)
   OBSTETRICS POSTPARTUM CLINIC PROGRESS NOTE  Subjective:     Laurie Petersen is a 27 y.o. [phone] female who presents for a postpartum visit. She is 6 weeks postpartum following a low cervical transverse Cesarean section with tubal ligation. I have fully reviewed the prenatal and intrapartum course. The delivery was at 39 gestational weeks.  Anesthesia: spinal. Postpartum course has been well. Baby's course has been well, but does have some reflux. Baby is feeding by breast. Bleeding: patient has not resumed menses, with No LMP recorded.. Bowel function is normal. Bladder function is normal. Patient is not sexually active.  Postpartum depression screening: negative (Edinburgh Postnatal Depression Scale Score is 2).  The following portions of the patient's history were reviewed and updated as appropriate: allergies, current medications, past family history, past medical history, past social history, past surgical history and problem list.   Review of Systems A comprehensive review of systems was negative except for: patient complains that her C-section scar is firm and numb   Objective:    BP 129/85   Pulse 72   Ht 5\' 6"  (1.676 m)   Wt 144 lb 3.2 oz (65.4 kg)   Breastfeeding? Yes   BMI 23.27 kg/m   General:  alert and no distress   Breasts:  inspection negative, no nipple discharge or bleeding, no masses or nodularity palpable  Lungs: clear to auscultation bilaterally  Heart:  regular rate and rhythm, S1, S2 normal, no murmur, click, rub or gallop  Abdomen: soft, non-tender; bowel sounds normal; no masses,  no organomegaly.  Well healed Pfannenstiel incision with firmness noted along the length of the incision with mild swelling (suspicious for resolving seroma vs hematoma).    Vulva:  normal  Vagina: normal vagina, no discharge, exudate, lesion, or erythema  Cervix:  no cervical motion tenderness and no lesions  Corpus: normal size, contour, position, consistency, mobility,  non-tender  Adnexa:  normal adnexa and no mass, fullness, tenderness  Rectal Exam: Not performed.         Labs:  Lab Results  Component Value Date   HGB 9.5 (L) 04/04/2017     Assessment:   Routine postpartum exam, s/p repeat C-section with BTL.   Mild anemia postpartum   Plan:   1. Contraception: tubal ligation.  2. Will check Hgb for h/o anemia.  3. Can resume all activities, work notice given to lift restrictions.  4. Follow up in: 4-6 months for annual exam.      Hildred Laserherry, Jo Cerone, MD [facility]

## 2017-11-18 IMAGING — US US OB COMP LESS 14 WK
1 series · 14 of 28 positions shown · non-contrast
Comparison: None.

CLINICAL DATA: 25-year-old pregnant female presenting with spotting
for 1 week. LMP: 07/13/2016 corresponding to an estimated
gestational age of 9 weeks, 6 days.

EXAM:
OBSTETRIC <14 WK ULTRASOUND
TECHNIQUE: Transabdominal ultrasound was performed for evaluation of the
gestation as well as the maternal uterus and adnexal regions.

[Series 1: us ob comp less 14 wk · 0.20mm/px · 14 of 54 slices shown]
[im 2/54]
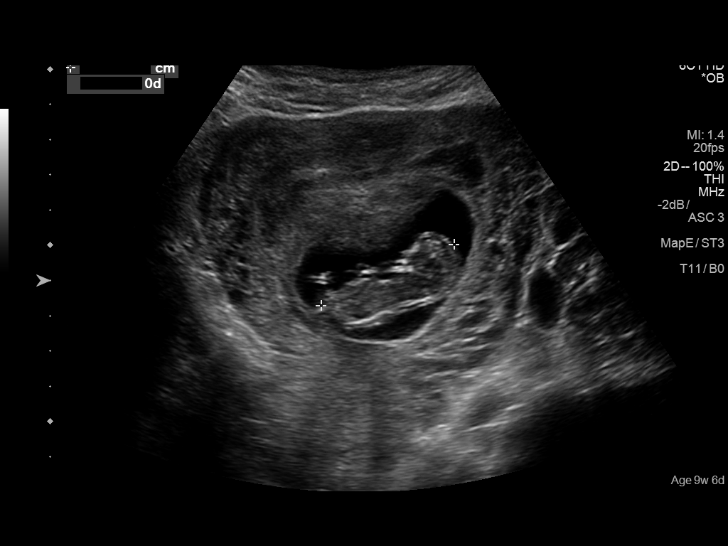
[im 6/54]
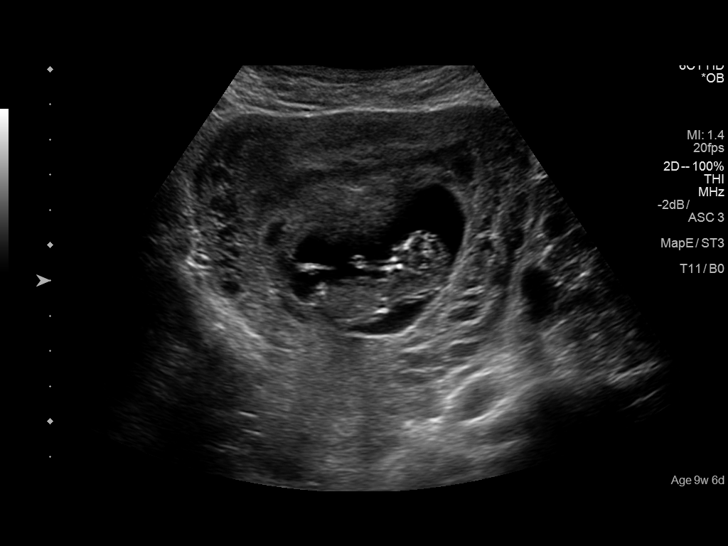
[im 10/54]
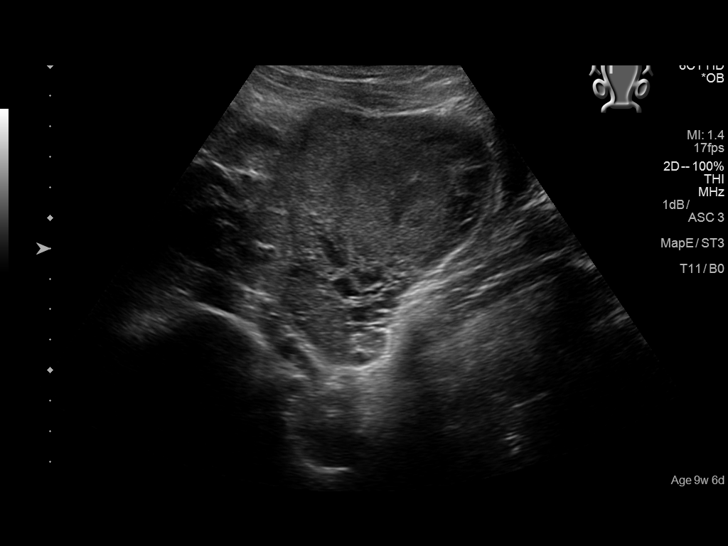
[im 14/54]
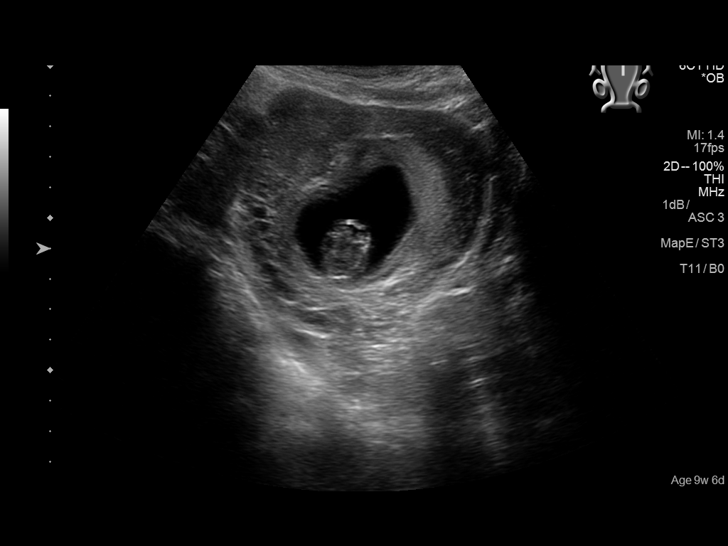
[im 18/54]
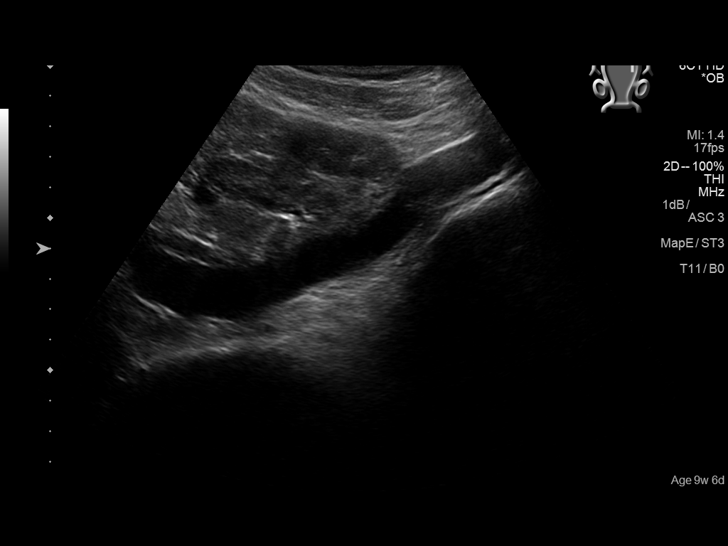
[im 22/54]
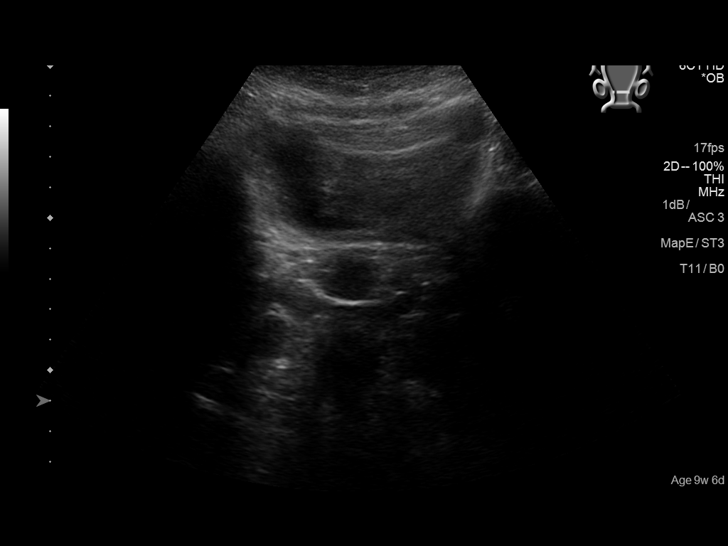
[im 26/54]
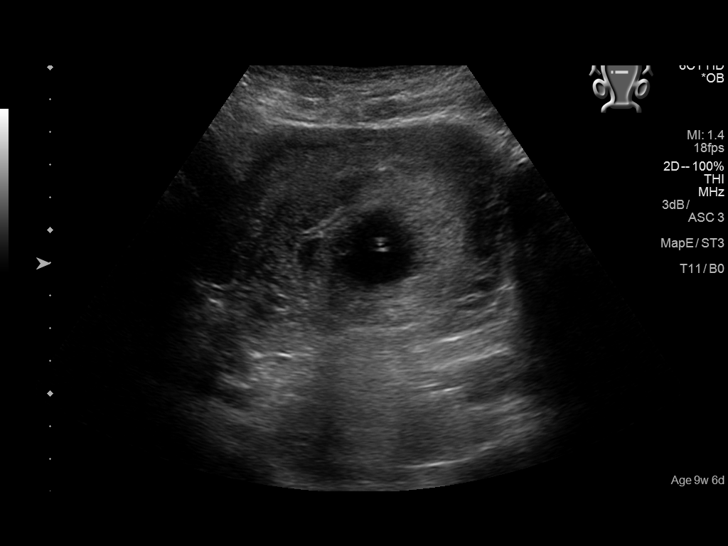
[im 30/54]
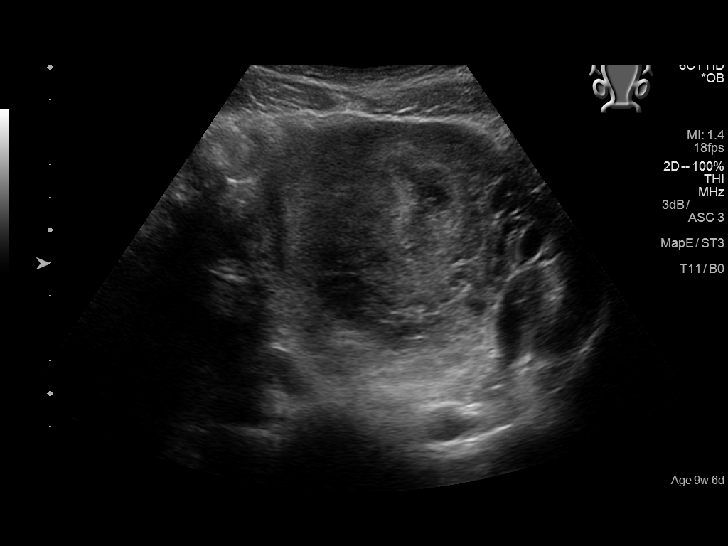
[im 34/54]
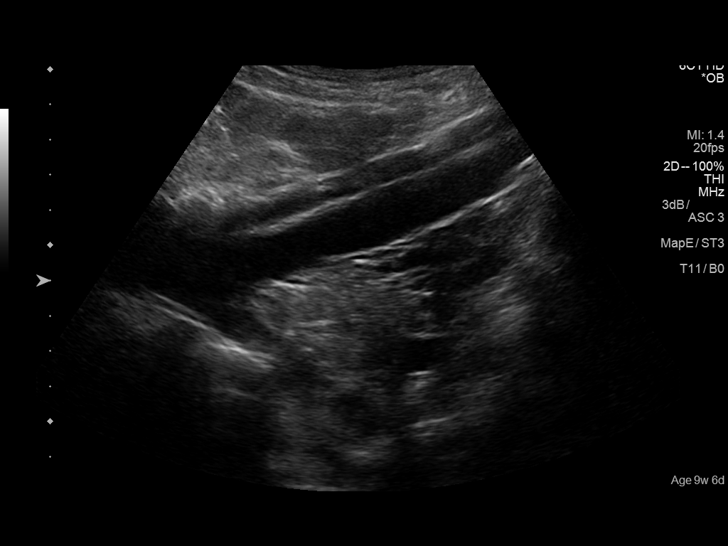
[im 38/54]
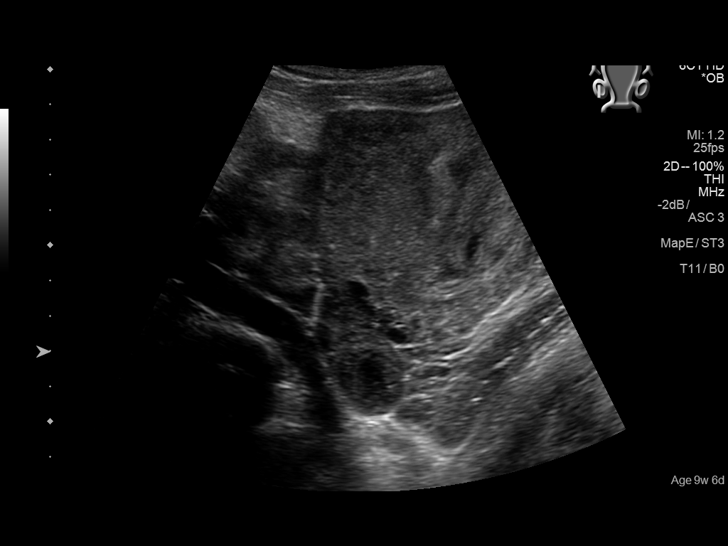
[im 42/54]
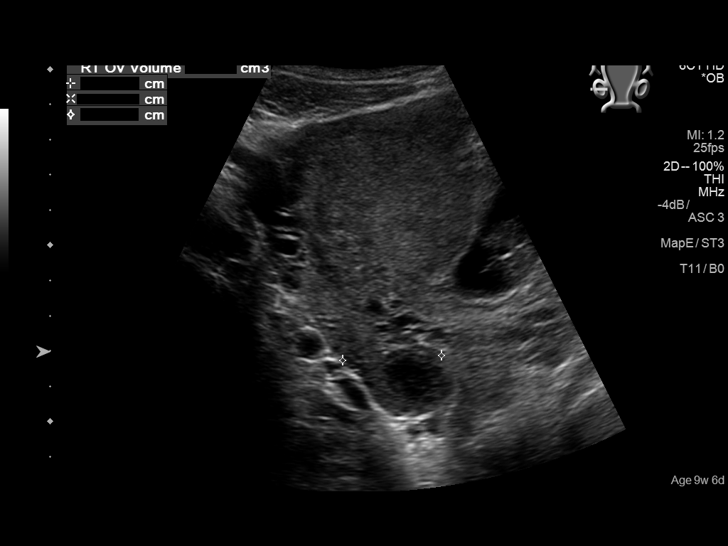
[im 46/54]
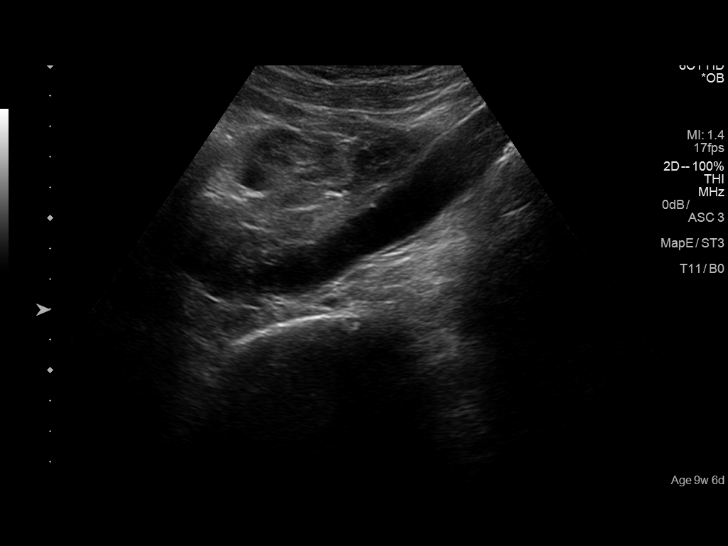
[im 50/54]
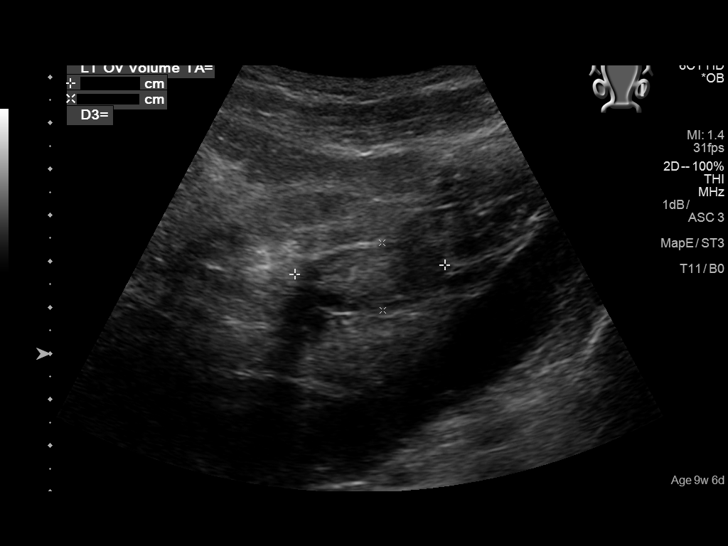
[im 54/54]
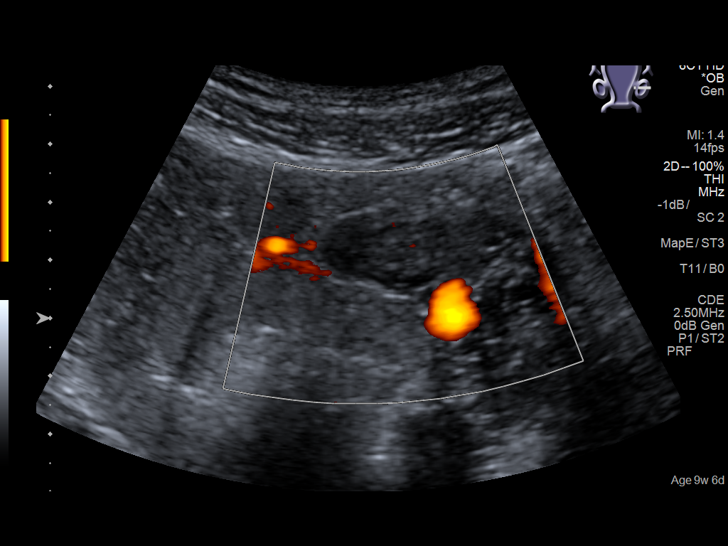

[14 of 28 positions shown; findings below may reference images not displayed]

FINDINGS: Intrauterine gestational sac: Single

Yolk sac:  Not seen

Embryo:  Present

Cardiac Activity: Detected

Heart Rate: 160 bpm

CRL:   42  mm   11 w 0 d                  US EDC: 04/11/2017

Subchorionic hemorrhage: Small subchorionic hemorrhage measuring
x 1.4 x 1.5 cm.

Maternal uterus/adnexae: The maternal ovaries appear unremarkable. A
2.2 x 1.9 x 2.0 cm corpus luteum is noted in the right ovary.
IMPRESSION: 1. Single live intrauterine pregnancy with an estimated gestational
age of 11 weeks, 0 days.
2. Small subchorionic hemorrhage.  Follow-up recommended.

## 2018-10-24 ENCOUNTER — Encounter: Payer: Self-pay | Admitting: Emergency Medicine

## 2018-10-24 ENCOUNTER — Other Ambulatory Visit: Payer: Self-pay

## 2018-10-24 DIAGNOSIS — Y929 Unspecified place or not applicable: Secondary | ICD-10-CM | POA: Insufficient documentation

## 2018-10-24 DIAGNOSIS — S025XXA Fracture of tooth (traumatic), initial encounter for closed fracture: Secondary | ICD-10-CM | POA: Insufficient documentation

## 2018-10-24 DIAGNOSIS — Y998 Other external cause status: Secondary | ICD-10-CM | POA: Insufficient documentation

## 2018-10-24 DIAGNOSIS — X58XXXA Exposure to other specified factors, initial encounter: Secondary | ICD-10-CM | POA: Insufficient documentation

## 2018-10-24 DIAGNOSIS — Y9389 Activity, other specified: Secondary | ICD-10-CM | POA: Insufficient documentation

## 2018-10-24 DIAGNOSIS — Z87891 Personal history of nicotine dependence: Secondary | ICD-10-CM | POA: Insufficient documentation

## 2018-10-24 NOTE — ED Triage Notes (Signed)
Patient ambulatory to triage with steady gait, without difficulty or distress noted; pt reports left sided dental pain x 2 days

## 2018-10-25 ENCOUNTER — Emergency Department
Admission: EM | Admit: 2018-10-25 | Discharge: 2018-10-25 | Disposition: A | Discharge status: Home / Self Care | Payer: Medicaid Other | Attending: Emergency Medicine | Admitting: Emergency Medicine

## 2018-10-25 DIAGNOSIS — S025XXA Fracture of tooth (traumatic), initial encounter for closed fracture: Secondary | ICD-10-CM

## 2018-10-25 DIAGNOSIS — K0889 Other specified disorders of teeth and supporting structures: Secondary | ICD-10-CM

## 2018-10-25 MED ORDER — AMOXICILLIN 500 MG PO CAPS
500.0000 mg | ORAL_CAPSULE | Freq: Once | ORAL | Status: AC
Start: 2018-10-25 — End: 2018-10-25
  Administered 2018-10-25: 01:00:00 500 mg via ORAL
  Filled 2018-10-25: qty 1

## 2018-10-25 MED ORDER — AMOXICILLIN 500 MG PO CAPS: 500.0000 mg | ORAL_CAPSULE | Freq: Three times a day (TID) | ORAL | 0 refills | Status: DC

## 2018-10-25 MED ORDER — HYDROCODONE-ACETAMINOPHEN 5-325 MG PO TABS
1.0000 | ORAL_TABLET | Freq: Once | ORAL | Status: AC
  Administered 2018-10-25: 01:00:00 1 via ORAL
  Filled 2018-10-25: qty 1

## 2018-10-25 MED ORDER — HYDROCODONE-ACETAMINOPHEN 5-325 MG PO TABS: 1.0000 | ORAL_TABLET | Freq: Four times a day (QID) | ORAL | 0 refills | Status: DC | PRN

## 2018-10-25 MED ORDER — LIDOCAINE VISCOUS HCL 2 % MT SOLN
15.0000 mL | Freq: Once | OROMUCOSAL | Status: AC
  Administered 2018-10-25: 15 mL via OROMUCOSAL
  Filled 2018-10-25: qty 15

## 2018-10-25 MED ORDER — IBUPROFEN 600 MG PO TABS
600.0000 mg | ORAL_TABLET | Freq: Once | ORAL | Status: AC
  Administered 2018-10-25: 01:00:00 600 mg via ORAL
  Filled 2018-10-25: qty 1

## 2018-10-25 NOTE — ED Provider Notes (Signed)
[facility] Emergency Department Provider Note   ____________________________________________   First MD Initiated Contact with Patient 10/25/18 0041     (approximate)  I have reviewed the triage vital signs and the nursing notes.   HISTORY  Chief Complaint Dental Pain    HPI Laurie Petersen is a 28 y.o. female who presents to the ED from home with a chief complaint of dental pain. Patient states she has had a "hole" in her left lower molar for "a while". Ate something 2 days ago which flared up pain in her left lower molar. Denies associated fever, cough, vomiting. Denies recent travel or trauma.       Past Medical History:  Diagnosis Date  . Anemia     Patient Active Problem List   Diagnosis Date Noted  . Cesarean delivery delivered 04/04/2017  . Irregular uterine contractions 03/27/2017  . Late prenatal care affecting pregnancy in second trimester 11/24/2016  . H/O cesarean section complicating pregnancy 11/24/2016  . Supervision of normal intrauterine pregnancy in multigravida in second trimester 11/24/2016  . History of postpartum hypertension 11/24/2016  . Short interval between pregnancies affecting pregnancy, antepartum 11/24/2016  . Hypertensive emergency 02/09/2016  . Accelerated hypertension 02/08/2016  . Postpartum care following cesarean delivery 01/26/2016  . History of marijuana use 12/08/2015  . Monoamniotic monochorionic twin pregnancy, antepartum 12/08/2015    Past Surgical History:  Procedure Laterality Date  . CESAREAN SECTION    . CESAREAN SECTION WITH BILATERAL TUBAL LIGATION Bilateral 04/04/2017   Procedure: REPEAT CESAREAN SECTION WITH BILATERAL TUBAL LIGATION;  Surgeon: Linzie CollinEvans, David James, MD;  Location: [facility] ORS;  Service: Obstetrics;  Laterality: Bilateral;  . TONSILLECTOMY      Prior to Admission medications   Medication Sig Start Date End Date Taking? Authorizing Provider  amoxicillin (AMOXIL) 500 MG capsule  Take 1 capsule (500 mg total) by mouth 3 (three) times daily. 10/25/18   Irean HongSung,  J, MD  HYDROcodone-acetaminophen (NORCO) 5-325 MG tablet Take 1 tablet by mouth every 6 (six) hours as needed for moderate pain. 10/25/18   Irean HongSung,  J, MD  oxyCODONE (OXY IR/ROXICODONE) 5 MG immediate release tablet Take 1 tablet (5 mg total) by mouth every 4 (four) hours as needed for severe pain. Patient not taking: Reported on 05/29/2017 04/06/17   Linzie CollinEvans, David James, MD  oxyCODONE (ROXICODONE) 5 MG immediate release tablet Take 1 tablet (5 mg total) by mouth every 4 (four) hours as needed for severe pain. Patient not taking: Reported on 05/29/2017 04/09/17   Henrene DodgePiscoya, Jose, MD  phenylephrine (,USE FOR PREPARATION-H,) 0.25 % suppository Place 1 suppository rectally 2 (two) times daily. Patient not taking: Reported on 05/29/2017 04/09/17   Rebecka ApleyWebster, Allison P, MD  Prenatal-DSS-FeCb-FeGl-FA [facility](CITRANATAL BLOOM) 90-1 MG TABS Take 90 mg by mouth daily. 11/22/16   Hildred Laserherry, [name], MD    Allergies Patient has no known allergies.  Family History  Problem Relation Age of Onset  . Arthritis Maternal Grandmother   . Diabetes Maternal Grandmother     Social History Social History   Tobacco Use  . Smoking status: Former Smoker    Packs/day: 0.50    Types: Cigarettes    Quit date: 08/2016    Years since quitting: 2.2  . Smokeless tobacco: Never Used  Substance Use Topics  . Alcohol use: No  . Drug use: No    Review of Systems  Constitutional: No fever/chills Eyes: No visual changes. ENT: Positive for dental pain. No sore throat. Cardiovascular: Denies  chest pain. Respiratory: Denies shortness of breath. Gastrointestinal: No abdominal pain.  No nausea, no vomiting.  No diarrhea.  No constipation. Genitourinary: Negative for dysuria. Musculoskeletal: Negative for back pain. Skin: Negative for rash. Neurological: Negative for headaches, focal weakness or numbness.   ____________________________________________    PHYSICAL EXAM:  VITAL SIGNS: ED Triage Vitals  Enc Vitals Group     BP 10/24/18 2150 129/89     Pulse Rate 10/24/18 2150 85     Resp 10/24/18 2150 16     Temp 10/24/18 2150 98.9 F (37.2 C)     Temp Source 10/24/18 2150 Oral     SpO2 10/24/18 2150 100 %     Weight --      Height --      Head Circumference --      Peak Flow --      Pain Score 10/24/18 2144 10     Pain Loc --      Pain Edu? --      Excl. in [place]? --     Constitutional: Alert and oriented. Well appearing and in no acute distress. Eyes: Conjunctivae are normal. PERRL. EOMI. Head: Atraumatic. Nose: No congestion/rhinnorhea. Mouth/Throat: Mucous membranes are moist.  Cracks in both left lower posterior as well as upper left posterior molar.  Lower molar tender to palpation with tongue blade. Neck: No stridor.   Cardiovascular: Normal rate, regular rhythm. Grossly normal heart sounds.  Good peripheral circulation. Respiratory: Normal respiratory effort.  No retractions. Lungs CTAB. Gastrointestinal: Soft and nontender. No distention. No abdominal bruits. No CVA tenderness. Musculoskeletal: No lower extremity tenderness nor edema.  No joint effusions. Neurologic:  Normal speech and language. No gross focal neurologic deficits are appreciated. No gait instability. Skin:  Skin is warm, dry and intact. No rash noted. Psychiatric: Mood and affect are normal. Speech and behavior are normal.  ____________________________________________   LABS (all labs ordered are listed, but only abnormal results are displayed)  Labs Reviewed - No data to display ____________________________________________  EKG  None ____________________________________________  RADIOLOGY  ED MD interpretation:  None  Official radiology report(s): No results found.  ____________________________________________   PROCEDURES  Procedure(s) performed (including Critical Care):  Procedures    ____________________________________________   INITIAL IMPRESSION / ASSESSMENT AND PLAN / ED COURSE  As part of my medical decision making, I reviewed the following data within the [place] notes reviewed and incorporated and Notes from prior ED visits     Laurie Petersen was evaluated in Emergency Department on 10/25/2018 for the symptoms described in the history of present illness. She was evaluated in the context of the global COVID-19 pandemic, which necessitated consideration that the patient might be at risk for infection with the SARS-CoV-2 virus that causes COVID-19. Institutional protocols and algorithms that pertain to the evaluation of patients at risk for COVID-19 are in a state of rapid change based on information released by regulatory bodies including the CDC and federal and state organizations. These policies and algorithms were followed during the patient's care in the ED.   28 year old female who presents with dentalgia secondary to cracked molar. Will place on Amoxicillin, analgesia and outpatient dental follow-up. Strict return precautions given. Patient verbalizes understanding and agrees with plan of care.      ____________________________________________   FINAL CLINICAL IMPRESSION(S) / ED DIAGNOSES  Final diagnoses:  Closed fracture of tooth, initial encounter  Pain, dental     ED Discharge Orders  Ordered    amoxicillin (AMOXIL) 500 MG capsule  3 times daily     10/25/18 0051    HYDROcodone-acetaminophen (NORCO) 5-325 MG tablet  Every 6 hours PRN     10/25/18 0051           Note:  This document was prepared using Dragon voice recognition software and may include unintentional dictation errors.   Irean HongSung,  J, MD 10/25/18 [phone]

## 2018-10-25 NOTE — Discharge Instructions (Addendum)
Take antibiotic as prescribed (Amoxicillin 500mg  three times daily x 7 days). You may take ibuprofen as needed for pain; Norco as needed for more severe pain. Return to the ER for worsening symptoms, persistent vomiting, difficulty breathing or other concerns.

## 2020-12-30 ENCOUNTER — Ambulatory Visit: Payer: Self-pay

## 2021-01-03 ENCOUNTER — Encounter: Payer: Self-pay | Admitting: Advanced Practice Midwife

## 2021-01-03 ENCOUNTER — Ambulatory Visit: Payer: Self-pay | Admitting: Advanced Practice Midwife

## 2021-01-03 ENCOUNTER — Other Ambulatory Visit: Payer: Self-pay

## 2021-01-03 DIAGNOSIS — Z62810 Personal history of physical and sexual abuse in childhood: Secondary | ICD-10-CM | POA: Insufficient documentation

## 2021-01-03 DIAGNOSIS — Z113 Encounter for screening for infections with a predominantly sexual mode of transmission: Secondary | ICD-10-CM

## 2021-01-03 DIAGNOSIS — Z9851 Tubal ligation status: Secondary | ICD-10-CM

## 2021-01-03 DIAGNOSIS — F17200 Nicotine dependence, unspecified, uncomplicated: Secondary | ICD-10-CM | POA: Insufficient documentation

## 2021-01-03 LAB — WET PREP FOR TRICH, YEAST, CLUE
Trichomonas Exam: NEGATIVE
Yeast Exam: NEGATIVE

## 2021-01-03 MED ORDER — METRONIDAZOLE 500 MG PO TABS: 500.0000 mg | ORAL_TABLET | Freq: Two times a day (BID) | ORAL | 0 refills | Status: DC

## 2021-01-03 NOTE — Progress Notes (Signed)
Wet mount reviewed by Hazle Coca CNM and client treated for BV per standing order (per E. Sciora CNM). Jossie Ng, RN

## 2021-01-03 NOTE — Progress Notes (Signed)
[facility] Department STI clinic/screening visit  Subjective:  Laurie Petersen is a 30 y.o. SBF G3P4 smoker female being seen today for an STI screening visit. The patient reports they do not have symptoms.  Patient reports that they do not desire a pregnancy in the next year.   They reported they are not interested in discussing contraception today.  Patient's last menstrual period was 12/27/2020 (exact date).   Patient has the following medical conditions:   Patient Active Problem List   Diagnosis Date Noted   Cesarean delivery delivered 04/04/2017   Late prenatal care affecting pregnancy in second trimester 11/24/2016   History of postpartum hypertension 11/24/2016   Hypertensive emergency 02/09/2016   Accelerated hypertension 02/08/2016   History of marijuana use 12/08/2015   Monoamniotic monochorionic twin pregnancy, antepartum 12/08/2015    Chief Complaint  Patient presents with   SEXUALLY TRANSMITTED DISEASE    HPI  Patient reports new partner and wants to be screened. Last sex 12/31/20 without condom; with current partner x 4 mo; 2 partners in last 3 mo. Last MJ today. Last cig today. Last ETOH yesterday (3 shots Tequila) qo weekend. LMP 12/27/20. Last douched 2 wks ago  Last HIV test per patient/review of record was 10/25/16 Patient reports last pap was 2019 neg  See flowsheet for further details and programmatic requirements.    The following portions of the patient's history were reviewed and updated as appropriate: allergies, current medications, past medical history, past social history, past surgical history and problem list.  Objective:  There were no vitals filed for this visit.  Physical Exam Vitals and nursing note reviewed.  Constitutional:      Appearance: Normal appearance. She is normal weight.  HENT:     Head: Normocephalic and atraumatic.     Mouth/Throat:     Mouth: Mucous membranes are moist.     Pharynx: Oropharynx is clear. No  oropharyngeal exudate or posterior oropharyngeal erythema.  Eyes:     Conjunctiva/sclera: Conjunctivae normal.  Pulmonary:     Effort: Pulmonary effort is normal.  Abdominal:     General: Abdomen is flat.     Palpations: Abdomen is soft. There is no mass.     Tenderness: There is no abdominal tenderness. There is no rebound.     Comments: Soft without masses or tenderness, good tone  Genitourinary:    General: Normal vulva.     Exam position: Lithotomy position.     Pubic Area: No rash or pubic lice.      Labia:        Right: No rash or lesion.        Left: No rash or lesion.      Vagina: Vaginal discharge (grey creamy leukorrhea, ph>4.5 with malodor) present. No erythema, bleeding or lesions.     Cervix: Normal.     Uterus: Normal.      Adnexa: Right adnexa normal and left adnexa normal.     Rectum: Normal.  Lymphadenopathy:     Head:     Right side of head: No preauricular or posterior auricular adenopathy.     Left side of head: No preauricular or posterior auricular adenopathy.     Cervical: No cervical adenopathy.     Upper Body:     Right upper body: No supraclavicular or axillary adenopathy.     Left upper body: No supraclavicular or axillary adenopathy.     Lower Body: No right inguinal adenopathy. No left inguinal adenopathy.  Skin:    General: Skin is warm and dry.     Findings: No rash.  Neurological:     Mental Status: She is alert and oriented to person, place, and time.     Assessment and Plan:  Laurie Petersen is a 29 y.o. female presenting to the [facility] Department for STI screening  1. Screening examination for venereal disease Treat wet mount per standing orders Immunization nurse consult - WET PREP FOR TRICH, YEAST, CLUE - Chlamydia/Gonorrhea [facility] - Syphilis Serology, [place] [facility] - HIV [place] [facility]     No follow-ups on file.  No future appointments.  Alberteen Spindle, CNM

## 2021-01-07 LAB — GONOCOCCUS CULTURE

## 2021-05-02 ENCOUNTER — Ambulatory Visit: Payer: Self-pay

## 2021-05-02 ENCOUNTER — Other Ambulatory Visit: Payer: Self-pay

## 2021-05-02 ENCOUNTER — Ambulatory Visit: Payer: Self-pay | Admitting: Advanced Practice Midwife

## 2021-05-02 ENCOUNTER — Encounter: Payer: Self-pay | Admitting: Advanced Practice Midwife

## 2021-05-02 DIAGNOSIS — Z3009 Encounter for other general counseling and advice on contraception: Secondary | ICD-10-CM | POA: Diagnosis not present

## 2021-05-02 DIAGNOSIS — Z113 Encounter for screening for infections with a predominantly sexual mode of transmission: Secondary | ICD-10-CM

## 2021-05-02 DIAGNOSIS — Z0389 Encounter for observation for other suspected diseases and conditions ruled out: Secondary | ICD-10-CM | POA: Diagnosis not present

## 2021-05-02 DIAGNOSIS — Z1388 Encounter for screening for disorder due to exposure to contaminants: Secondary | ICD-10-CM | POA: Diagnosis not present

## 2021-05-02 LAB — WET PREP FOR TRICH, YEAST, CLUE
Trichomonas Exam: NEGATIVE
Yeast Exam: NEGATIVE
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
## 2021-05-02 NOTE — Progress Notes (Signed)
Wet prep reviewed - negative results. Daishon Chui, RN  

## 2021-05-07 LAB — GONOCOCCUS CULTURE

## 2021-06-17 ENCOUNTER — Other Ambulatory Visit: Payer: Self-pay

## 2021-06-17 ENCOUNTER — Encounter: Payer: Self-pay | Admitting: Advanced Practice Midwife

## 2021-06-17 ENCOUNTER — Ambulatory Visit: Payer: Medicaid Other | Admitting: Advanced Practice Midwife

## 2021-06-17 DIAGNOSIS — N760 Acute vaginitis: Secondary | ICD-10-CM

## 2021-06-17 DIAGNOSIS — Z113 Encounter for screening for infections with a predominantly sexual mode of transmission: Secondary | ICD-10-CM

## 2021-06-17 DIAGNOSIS — Z91410 Personal history of adult physical and sexual abuse: Secondary | ICD-10-CM | POA: Insufficient documentation

## 2021-06-17 DIAGNOSIS — B9689 Other specified bacterial agents as the cause of diseases classified elsewhere: Secondary | ICD-10-CM

## 2021-06-17 LAB — WET PREP FOR TRICH, YEAST, CLUE
Trichomonas Exam: NEGATIVE
Yeast Exam: NEGATIVE

## 2021-06-17 MED ORDER — METRONIDAZOLE 500 MG PO TABS: 500.0000 mg | ORAL_TABLET | Freq: Two times a day (BID) | ORAL | 0 refills | Status: AC
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
## 2021-06-17 NOTE — Addendum Note (Signed)
Addended by: Pietro Cassis on: 06/17/2021 05:13 PM ? ? Modules accepted: Orders ? ?

## 2021-06-17 NOTE — Progress Notes (Signed)
Pt here for STI screening. Treated for BV per verbal order provider. Medication counseling given.  ?Bwiedenheft RN ?

## 2021-06-22 LAB — GONOCOCCUS CULTURE

## 2021-08-10 DIAGNOSIS — R1030 Lower abdominal pain, unspecified: Secondary | ICD-10-CM | POA: Diagnosis not present

## 2021-08-10 DIAGNOSIS — N898 Other specified noninflammatory disorders of vagina: Secondary | ICD-10-CM | POA: Diagnosis not present

## 2021-08-10 DIAGNOSIS — R3914 Feeling of incomplete bladder emptying: Secondary | ICD-10-CM | POA: Diagnosis not present

## 2021-08-10 DIAGNOSIS — Z113 Encounter for screening for infections with a predominantly sexual mode of transmission: Secondary | ICD-10-CM | POA: Diagnosis not present

## 2021-08-10 DIAGNOSIS — R350 Frequency of micturition: Secondary | ICD-10-CM | POA: Diagnosis not present

## 2021-08-10 DIAGNOSIS — R300 Dysuria: Secondary | ICD-10-CM | POA: Diagnosis not present

## 2021-08-10 DIAGNOSIS — N390 Urinary tract infection, site not specified: Secondary | ICD-10-CM | POA: Diagnosis not present

## 2021-08-10 DIAGNOSIS — R109 Unspecified abdominal pain: Secondary | ICD-10-CM | POA: Diagnosis not present

## 2021-08-11 ENCOUNTER — Ambulatory Visit: Payer: Medicaid Other

## 2021-08-24 ENCOUNTER — Encounter: Payer: Self-pay | Admitting: Obstetrics and Gynecology

## 2021-08-24 DIAGNOSIS — Z021 Encounter for pre-employment examination: Secondary | ICD-10-CM | POA: Diagnosis not present

## 2021-10-10 ENCOUNTER — Encounter: Payer: Medicaid Other | Admitting: Obstetrics and Gynecology

## 2021-11-22 ENCOUNTER — Encounter: Payer: Self-pay | Admitting: Obstetrics and Gynecology

## 2021-11-22 DIAGNOSIS — Z7689 Persons encountering health services in other specified circumstances: Secondary | ICD-10-CM

## 2024-01-08 ENCOUNTER — Ambulatory Visit

## 2024-01-11 ENCOUNTER — Ambulatory Visit

## 2024-01-17 ENCOUNTER — Ambulatory Visit

## 2024-01-17 ENCOUNTER — Other Ambulatory Visit: Payer: Self-pay | Admitting: Family Medicine

## 2024-01-17 VITALS — BP 119/72

## 2024-01-17 DIAGNOSIS — Z113 Encounter for screening for infections with a predominantly sexual mode of transmission: Secondary | ICD-10-CM

## 2024-01-17 DIAGNOSIS — N946 Dysmenorrhea, unspecified: Secondary | ICD-10-CM

## 2024-01-17 DIAGNOSIS — Z30011 Encounter for initial prescription of contraceptive pills: Secondary | ICD-10-CM

## 2024-01-17 LAB — HM HIV SCREENING LAB: HM HIV Screening: NEGATIVE

## 2024-01-17 LAB — WET PREP FOR TRICH, YEAST, CLUE
Clue Cell Exam: NEGATIVE
Trichomonas Exam: NEGATIVE
Yeast Exam: NEGATIVE

## 2024-01-17 LAB — HEPATITIS B SURFACE ANTIGEN

## 2024-01-17 MED ORDER — NORETHINDRONE 0.35 MG PO TABS: 1.0000 | ORAL_TABLET | Freq: Every day | ORAL | 2 refills | Status: DC
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
## 2024-01-17 NOTE — Progress Notes (Signed)
 Pt is here for STD screening. Wet Prep results reviewed with pt and requires no treatment per provider. Opportunity given to pt to ask questions for any clarifications. Questions Answered. Condoms declined. Wilkie Drought, RN.

## 2024-01-17 NOTE — Progress Notes (Signed)
 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   Dorothyann Helling, MD 01/17/24  11:18 AM

## 2024-01-22 LAB — GONOCOCCUS CULTURE

## 2024-01-28 ENCOUNTER — Telehealth: Payer: Self-pay

## 2024-01-28 ENCOUNTER — Other Ambulatory Visit: Payer: Self-pay | Admitting: Family Medicine

## 2024-01-28 DIAGNOSIS — B3731 Acute candidiasis of vulva and vagina: Secondary | ICD-10-CM

## 2024-01-28 DIAGNOSIS — Z205 Contact with and (suspected) exposure to viral hepatitis: Secondary | ICD-10-CM | POA: Insufficient documentation

## 2024-01-28 LAB — HIV/HCV ~~LOC~~ LAB
HIV Ag/Ab Combo: NONREACTIVE
Hepatitis C Ab: REACTIVE
Hepatitis C Quantitation: NOT DETECTED
Hepatitis C Quantitation: NOT DETECTED
Hepatitis C RNA-PCR: NOT DETECTED

## 2024-01-28 MED ORDER — FLUCONAZOLE 150 MG PO TABS: 150.0000 mg | ORAL_TABLET | Freq: Once | ORAL | 0 refills | Status: AC

## 2024-01-28 NOTE — Progress Notes (Signed)
 Rx fluconazole for NP Damien Satchel, given trouble with Medicaid provider enrollment.   Dorothyann Helling, MD 01/28/24  4:20 PM

## 2024-01-28 NOTE — Telephone Encounter (Signed)
 Phone call to pt at [phone]. Pt answered and confirmed password. Counseled pt re Hep C results which indicate she had the infection and then cleared it, not currently infected. Discussed risk factors for reexposure. Answered questions and pt expressed understanding.

## 2024-01-28 NOTE — Telephone Encounter (Signed)
(  Laurie Petersen is contacting pt.)

## 2024-01-28 NOTE — Telephone Encounter (Signed)
 Phone call with pt regarding test results. Pt states since her visit on 01/17/24, her vaginal discharge is heavier and it is uncomfortable. Denies itching. No other symptoms noted.  Reviewed negative wet mount and negative vaginal GC/CT results from 01/17/24 specimens with pt.  Pt states she is sure it is a yeast problem. Declines to schedule an appt at [facility] at this time, really does not want to take off work again. Requests a yeast pill be called into her pharmacy, [facility] [address]  [facility] RN counseled pt that request would be sent to provider for review and provider to advise.

## 2024-01-28 NOTE — Telephone Encounter (Signed)
 Returned patient call regarding concern for a yeast infection. She was seen on 10/16 and her wet prep was negative. She has since developed a white, thick and clumpy discharge. When seen on 10/16, a green discharge was noted as well as elevated pH. She however did not meet criteria for BV due to negative clue/amine. Her g/c testing has come back negative per Treva Bold (not yet uploaded in system).  Let patient know I can send in a fluconazole, but that if her symptoms do not improve with that we should repeat screening for bacterial vaginosis.  Patient verbalizes understanding. She is not pregnant (had a BTL). Fluconazole sent by Dr. Macario.  Damien Satchel [facility]

## 2024-01-28 NOTE — Telephone Encounter (Signed)
 Call pt re Hep C test results from 01/17/24 specimen. Anti- HCV Antibody Reactive HCV RNAs Not Detected

## 2024-02-11 ENCOUNTER — Other Ambulatory Visit (HOSPITAL_COMMUNITY)
Admission: RE | Admit: 2024-02-11 | Discharge: 2024-02-11 | Disposition: A | Discharge status: Home / Self Care | Source: Ambulatory Visit

## 2024-02-11 ENCOUNTER — Ambulatory Visit: Payer: Self-pay

## 2024-02-11 VITALS — BP 108/72 | HR 89 | Ht 67.0 in | Wt 164.9 lb

## 2024-02-11 DIAGNOSIS — Z23 Encounter for immunization: Secondary | ICD-10-CM

## 2024-02-11 DIAGNOSIS — N898 Other specified noninflammatory disorders of vagina: Secondary | ICD-10-CM | POA: Diagnosis present

## 2024-02-11 DIAGNOSIS — Z136 Encounter for screening for cardiovascular disorders: Secondary | ICD-10-CM | POA: Diagnosis not present

## 2024-02-11 DIAGNOSIS — Z131 Encounter for screening for diabetes mellitus: Secondary | ICD-10-CM

## 2024-02-11 DIAGNOSIS — L989 Disorder of the skin and subcutaneous tissue, unspecified: Secondary | ICD-10-CM | POA: Insufficient documentation

## 2024-02-11 DIAGNOSIS — E611 Iron deficiency: Secondary | ICD-10-CM | POA: Insufficient documentation

## 2024-02-11 DIAGNOSIS — N920 Excessive and frequent menstruation with regular cycle: Secondary | ICD-10-CM | POA: Insufficient documentation

## 2024-02-11 DIAGNOSIS — J069 Acute upper respiratory infection, unspecified: Secondary | ICD-10-CM

## 2024-02-11 NOTE — Patient Instructions (Signed)
 I value your feedback, so if you receive a survey, please take the time to fill it out. Thank you for choosing [facility]!

## 2024-02-11 NOTE — Progress Notes (Signed)
 New patient visit   Patient: Elinore Shults   DOB: April 23, 1990   33 y.o. Female  MRN: [id number] Visit Date: 02/11/2024  Today's healthcare provider: Isaiah DELENA Pepper, MD   Chief Complaint  Patient presents with   New Patient (Initial Visit)    Patient is present to establish care and discuss concerns with lump on left side of chin, abnormal labs, vaginal discharge and nasal congestion.   Nasal Congestion    Nasal congestion X 1 week with green phlegm. No treatment tried   Cyst    Located on left side of chin X 5 years. No pain, fluctuating in size and moves around if she tries pushing in   Vaginal Discharge   Subjective    Ita Maciel Kegg is a 33 y.o. female who presents today as a new patient to establish care.   Discussed the use of AI scribe software for clinical note transcription with the patient, who gave verbal consent to proceed.  History of Present Illness Zerina Matisyn Cabeza is a 33 year old female who presents with concerns of recurrent bacterial vaginosis and a persistent neck mass.  She has experienced recurrent episodes of bacterial vaginosis over the past year. Treatments have often resulted in subsequent yeast infections, causing frustration. She is interested in exploring alternative treatment options, such as a gel formulation instead of oral pills. Previous tests for bacterial vaginosis, yeast infections, and other sexually transmitted infections at the health department were negative, but she feels symptomatic and seeks further evaluation.  She has a persistent neck mass that has been present for approximately five years. The mass is non-tender and unchanged in size. She attempted to express the mass without success. There is a family history of endometriosis and related conditions. She is interested in having the mass evaluated and potentially removed.  She recently experienced symptoms of a head cold, including congestion and phlegm production,  but tested negative for COVID-19. She attributes the cold to exposure from her children and other family members who have been ill. No significant cough or loss of taste. Symptoms improving on their own.  She is about to start birth control pills to help regulate her menstrual cycle, which has been heavy since having her tubes tied. She has not yet started the medication but plans to begin following her recent menstrual cycle.  Pap smear done at health department in the past month.   Past Medical History:  Diagnosis Date   Allergy    Anemia    Arthritis    Hypertension    Past Surgical History:  Procedure Laterality Date   CESAREAN SECTION     CESAREAN SECTION WITH BILATERAL TUBAL LIGATION Bilateral 04/04/2017   Procedure: REPEAT CESAREAN SECTION WITH BILATERAL TUBAL LIGATION;  Surgeon: Janit Alm Agent, MD;  Location: [facility] ORS;  Service: Obstetrics;  Laterality: Bilateral;   TONSILLECTOMY     Family Status  Relation Name Status   MGM  (Not Specified)  No partnership data on file   Family History  Problem Relation Age of Onset   Arthritis Maternal Grandmother    Diabetes Maternal Grandmother    Social History   Socioeconomic History   Marital status: Single    Spouse name: Veatrice Sharps   Number of children: 3   Years of education: Not on file   Highest education level: Not on file  Occupational History   Not on file  Tobacco Use   Smoking status: Former    Current packs/day: 0.50  Average packs/day: 0.5 packs/day for 10.0 years (5.0 ttl pk-yrs)    Types: Cigarettes   Smokeless tobacco: Never  Vaping Use   Vaping status: Never Used  Substance and Sexual Activity   Alcohol use: Yes    Alcohol/week: 3.0 standard drinks of alcohol    Types: 3 Shots of liquor per week    Comment: last use 06/11/21 2x/mo   Drug use: Yes    Types: Marijuana    Comment: daily   Sexual activity: Yes    Partners: Male    Birth control/protection: Surgical  Other Topics Concern   Not  on file  Social History Narrative   Not on file   Social Drivers of Health   Financial Resource Strain: Low Risk  (10/24/2018)   Overall Financial Resource Strain (CARDIA)    Difficulty of Paying Living Expenses: Not hard at all  Food Insecurity: No Food Insecurity (08/24/2021)   [facility] from [facility]   Hunger Vital Sign    Within the past 12 months, you worried that your food would run out before you got the money to buy more.: Never true    Within the past 12 months, the food you bought just didn't last and you didn't have money to get more.: Never true  Transportation Needs: No Transportation Needs (10/24/2018)   PRAPARE - Administrator, Civil Service (Medical): No    Lack of Transportation (Non-Medical): No  Physical Activity: Unknown (10/24/2018)   Exercise Vital Sign    Days of Exercise per Week: Patient declined    Minutes of Exercise per Session: Patient declined  Stress: No Stress Concern Present (10/24/2018)   [employer] of Occupational Health - Occupational Stress Questionnaire    Feeling of Stress : Only a little  Social Connections: Unknown (08/23/2021)   Received from [facility]   Social Network    Social Network: Not on file   Outpatient Medications Prior to Visit  Medication Sig   norethindrone (MICRONOR) 0.35 MG tablet Take 1 tablet (0.35 mg total) by mouth daily.   No facility-administered medications prior to visit.   No Known Allergies  Reviews of Systems as noted in HPI.      Objective    BP 108/72 (BP Location: Left Arm, Patient Position: Sitting, Cuff Size: Normal)   Pulse 89   Ht 5' 7 (1.702 m)   Wt 164 lb 14.4 oz (74.8 kg)   LMP 01/13/2024 (Approximate)   SpO2 99%   BMI 25.83 kg/m    Physical Exam Constitutional:      Appearance: Normal appearance.  HENT:     Head: Normocephalic and atraumatic.     Mouth/Throat:     Mouth: Mucous membranes are moist.  Eyes:     Pupils: Pupils are equal, round, and reactive  to light.  Cardiovascular:     Rate and Rhythm: Normal rate and regular rhythm.     Heart sounds: Normal heart sounds.  Pulmonary:     Effort: Pulmonary effort is normal.     Breath sounds: Normal breath sounds.  Skin:    General: Skin is warm.     Comments: ~1.5cm soft, mobile subcutaneous mass located at L chin  Neurological:     General: No focal deficit present.     Mental Status: She is alert.     Depression Screen    02/11/2024    1:59 PM  PHQ 2/9 Scores  PHQ - 2 Score 3  PHQ- 9 Score 9  No results found for any visits on 02/11/24.  Assessment & Plan      Problem List Items Addressed This Visit       Other   Lesion of neck   Relevant Orders   Ambulatory referral to Dermatology   Iron deficiency   Relevant Orders   CBC   Iron, TIBC and Ferritin Panel   Menorrhagia with regular cycle   Other Visit Diagnoses       Vaginal discharge    -  Primary   Relevant Orders   Cervicovaginal ancillary only     Need for vaccination       Relevant Orders   Flu vaccine trivalent PF, 6mos and older(Flulaval,Afluria,Fluarix,Fluzone) (Completed)     Screening for cardiovascular condition       Relevant Orders   Hemoglobin A1c     Screening for diabetes mellitus (DM)       Relevant Orders   Lipid panel     Viral upper respiratory tract infection           Assessment & Plan Vaginal discharge Recurrent discharge with previous negative tests for BV and yeast. Symptoms worsened. Differential includes recurrent BV or yeast infection.  - Performed self-swab for BV, yeast, chlamydia, gonorrhea, and trichomoniasis. - Prescribe flagyl  gel if swab results indicate BV  Subcutaneous neck mass Patient with ~1.5cm soft, mobile subcutaneous neck mass likely EIC or lipoma. Present for five years without change in size. Per patient, was previously imaged and she was told it was a cyst, although cannot find records. - Referred to dermatology for evaluation and possible removal. -  Will request records  Acute upper respiratory infection Patient with 1 week hx of nasal congestion and cough. Negative COVID and flu tests. Likely viral infection, now improving. - Recommended Mucinex for congestion. - Advised ibuprofen  or acetaminophen  for aches and pains. - Advised plenty of hydration  Menorrhagia Patient with hx of heavy periods, recently initiated on progesterone-only pill by health department. Discussed potential switch to combined oral contraceptive if ineffective. - Start progesterone-only pill  - Monitor effectiveness and consider switch to combined oral contraceptive if needed.  Iron deficiency Low blood counts and iron deficiency during previous pregnancies. No current supplementation. - Ordered blood tests to check blood counts and iron levels.  General Health Maintenance Routine health maintenance discussed, including screenings and flu vaccination. - Administered flu vaccine. - Ordered blood tests for cholesterol and diabetes screening.   Return in about 1 year (around 02/10/2025).     Isaiah DELENA Pepper, MD  [facility] [phone] (phone) [phone] (fax)

## 2024-02-12 ENCOUNTER — Ambulatory Visit: Payer: Self-pay

## 2024-02-12 ENCOUNTER — Other Ambulatory Visit: Payer: Self-pay

## 2024-02-12 DIAGNOSIS — D509 Iron deficiency anemia, unspecified: Secondary | ICD-10-CM

## 2024-02-12 LAB — CBC
Hematocrit: 28.2 % — ABNORMAL LOW (ref 34.0–46.6)
Hemoglobin: 7.6 g/dL — ABNORMAL LOW (ref 11.1–15.9)
MCH: 17.5 pg — ABNORMAL LOW (ref 26.6–33.0)
MCHC: 27 g/dL — ABNORMAL LOW (ref 31.5–35.7)
MCV: 65 fL — ABNORMAL LOW (ref 79–97)
Platelets: 310 x10E3/uL (ref 150–450)
RBC: 4.35 x10E6/uL (ref 3.77–5.28)
RDW: 19.6 % — ABNORMAL HIGH (ref 11.7–15.4)
WBC: 6 x10E3/uL (ref 3.4–10.8)

## 2024-02-12 LAB — IRON,TIBC AND FERRITIN PANEL
Ferritin: 11 ng/mL — ABNORMAL LOW (ref 15–150)
Iron Saturation: 5 % — CL (ref 15–55)
Iron: 20 ug/dL — ABNORMAL LOW (ref 27–159)
Total Iron Binding Capacity: 413 ug/dL (ref 250–450)
UIBC: 393 ug/dL (ref 131–425)

## 2024-02-12 LAB — LIPID PANEL
Chol/HDL Ratio: 2.5 ratio (ref 0.0–4.4)
Cholesterol, Total: 117 mg/dL (ref 100–199)
HDL: 46 mg/dL (ref >39)
LDL Chol Calc (NIH): 57 mg/dL (ref 0–99)
Triglycerides: 64 mg/dL (ref 0–149)
VLDL Cholesterol Cal: 14 mg/dL (ref 5–40)

## 2024-02-12 LAB — HEMOGLOBIN A1C
Est. average glucose Bld gHb Est-mCnc: 111 mg/dL
Hgb A1c MFr Bld: 5.5 % (ref 4.8–5.6)

## 2024-02-15 ENCOUNTER — Other Ambulatory Visit: Payer: Self-pay

## 2024-02-15 DIAGNOSIS — B9689 Other specified bacterial agents as the cause of diseases classified elsewhere: Secondary | ICD-10-CM

## 2024-02-15 LAB — CERVICOVAGINAL ANCILLARY ONLY
Bacterial Vaginitis (gardnerella): POSITIVE — AB
Candida Glabrata: NEGATIVE
Candida Vaginitis: NEGATIVE
Chlamydia: NEGATIVE
Comment: NEGATIVE
Comment: NEGATIVE
Comment: NEGATIVE
Comment: NEGATIVE
Comment: NEGATIVE
Comment: NORMAL
Neisseria Gonorrhea: NEGATIVE
Trichomonas: NEGATIVE

## 2024-02-15 MED ORDER — METRONIDAZOLE 500 MG PO TABS: 500.0000 mg | ORAL_TABLET | Freq: Two times a day (BID) | ORAL | 0 refills | Status: DC

## 2024-02-18 ENCOUNTER — Other Ambulatory Visit: Payer: Self-pay

## 2024-02-18 DIAGNOSIS — B9689 Other specified bacterial agents as the cause of diseases classified elsewhere: Secondary | ICD-10-CM

## 2024-02-18 MED ORDER — METRONIDAZOLE 0.75 % VA GEL: 1.0000 | Freq: Every day | VAGINAL | 0 refills | Status: AC

## 2024-02-24 ENCOUNTER — Other Ambulatory Visit: Payer: Self-pay | Admitting: Family Medicine

## 2024-02-24 DIAGNOSIS — Z30011 Encounter for initial prescription of contraceptive pills: Secondary | ICD-10-CM

## 2024-02-25 ENCOUNTER — Inpatient Hospital Stay

## 2024-02-25 ENCOUNTER — Inpatient Hospital Stay: Admitting: Oncology

## 2024-03-05 ENCOUNTER — Ambulatory Visit

## 2024-03-19 ENCOUNTER — Inpatient Hospital Stay

## 2024-03-19 ENCOUNTER — Inpatient Hospital Stay: Attending: Oncology | Admitting: Oncology

## 2024-03-19 ENCOUNTER — Encounter: Payer: Self-pay | Admitting: Oncology

## 2024-03-19 VITALS — BP 102/72 | HR 90 | Temp 97.7°F | Resp 18 | Wt 158.6 lb

## 2024-03-19 DIAGNOSIS — N920 Excessive and frequent menstruation with regular cycle: Secondary | ICD-10-CM | POA: Insufficient documentation

## 2024-03-19 DIAGNOSIS — R718 Other abnormality of red blood cells: Secondary | ICD-10-CM | POA: Insufficient documentation

## 2024-03-19 DIAGNOSIS — D500 Iron deficiency anemia secondary to blood loss (chronic): Secondary | ICD-10-CM

## 2024-03-19 DIAGNOSIS — Z139 Encounter for screening, unspecified: Secondary | ICD-10-CM

## 2024-03-19 DIAGNOSIS — Z87891 Personal history of nicotine dependence: Secondary | ICD-10-CM | POA: Insufficient documentation

## 2024-03-19 DIAGNOSIS — D509 Iron deficiency anemia, unspecified: Secondary | ICD-10-CM | POA: Diagnosis not present

## 2024-03-19 LAB — CBC WITH DIFFERENTIAL/PLATELET
Abs Immature Granulocytes: 0.01 K/uL (ref 0.00–0.07)
Basophils Absolute: 0 K/uL (ref 0.0–0.1)
Basophils Relative: 1 %
Eosinophils Absolute: 0.2 K/uL (ref 0.0–0.5)
Eosinophils Relative: 4 %
HCT: 27.6 % — ABNORMAL LOW (ref 36.0–46.0)
Hemoglobin: 7.8 g/dL — ABNORMAL LOW (ref 12.0–15.0)
Immature Granulocytes: 0 %
Lymphocytes Relative: 45 %
Lymphs Abs: 2.1 K/uL (ref 0.7–4.0)
MCH: 17.4 pg — ABNORMAL LOW (ref 26.0–34.0)
MCHC: 28.3 g/dL — ABNORMAL LOW (ref 30.0–36.0)
MCV: 61.6 fL — ABNORMAL LOW (ref 80.0–100.0)
Monocytes Absolute: 0.4 K/uL (ref 0.1–1.0)
Monocytes Relative: 8 %
Neutro Abs: 2 K/uL (ref 1.7–7.7)
Neutrophils Relative %: 42 %
Platelets: 222 K/uL (ref 150–400)
RBC: 4.48 MIL/uL (ref 3.87–5.11)
RDW: 20.4 % — ABNORMAL HIGH (ref 11.5–15.5)
WBC: 4.8 K/uL (ref 4.0–10.5)
nRBC: 0 % (ref 0.0–0.2)

## 2024-03-19 LAB — IRON AND TIBC
Iron: 17 ug/dL — ABNORMAL LOW (ref 28–170)
Saturation Ratios: 4 % — ABNORMAL LOW (ref 10.4–31.8)
TIBC: 468 ug/dL — ABNORMAL HIGH (ref 250–450)
UIBC: 451 ug/dL

## 2024-03-19 LAB — RETIC PANEL
Immature Retic Fract: 12.8 % (ref 2.3–15.9)
RBC.: 4.5 MIL/uL (ref 3.87–5.11)
Retic Count, Absolute: 36 K/uL (ref 19.0–186.0)
Retic Ct Pct: 0.8 % (ref 0.4–3.1)
Reticulocyte Hemoglobin: 15.9 pg — ABNORMAL LOW (ref >27.9)

## 2024-03-19 LAB — FERRITIN: Ferritin: 7 ng/mL — ABNORMAL LOW (ref 11–307)

## 2024-03-19 NOTE — Assessment & Plan Note (Signed)
Recommend patient to follow up with Gyn for management.

## 2024-03-19 NOTE — Progress Notes (Signed)
 Hematology/Oncology Consult note [phone] [phone]        REFERRING PROVIDER: Franchot Isaiah LABOR, MD   CHIEF COMPLAINTS/REASON FOR VISIT:  Evaluation of IDA   ASSESSMENT & PLAN:   IDA (iron deficiency anemia) Labs are reviewed and discussed with patient. Lab Results  Component Value Date   HGB 7.8 (L) 03/19/2024   TIBC 468 (H) 03/19/2024   IRONPCTSAT 4 (L) 03/19/2024   FERRITIN 7 (L) 03/19/2024    I discussed about option IV Venofer treatments. I discussed about the potential risks including but not limited to allergic reactions/infusion reactions including anaphylactic reactions, diarrhea, phlebitis, high blood pressure, wheezing, SOB, skin rash, weight gain,dark urine, leg swelling, back pain, headache, nausea and fatigue, etc. Patient agrees with the plan.  recommend IV venofer weekly x 5   Menorrhagia with regular cycle Recommend patient to follow up with Gyn for management  Microcytosis Chronic microcytosis. Check hemoglobinopathy and alpha thalassemia analysis   Orders Placed This Encounter  Procedures   Ferritin    Standing Status:   Future    Number of Occurrences:   1    Expected Date:   03/19/2024    Expiration Date:   06/17/2024   Iron and TIBC    Standing Status:   Future    Number of Occurrences:   1    Expected Date:   03/19/2024    Expiration Date:   06/17/2024   CBC with Differential/Platelet    Standing Status:   Future    Number of Occurrences:   1    Expected Date:   03/19/2024    Expiration Date:   06/17/2024   Retic Panel    Standing Status:   Future    Number of Occurrences:   1    Expected Date:   03/19/2024    Expiration Date:   06/17/2024   Hgb Fractionation Cascade    Standing Status:   Future    Number of Occurrences:   1    Expected Date:   03/19/2024    Expiration Date:   03/19/2025   Alpha-Thalassemia Analysis    Standing Status:   Future    Number of Occurrences:   1    Expected Date:   03/19/2024     Expiration Date:   03/19/2025    Indication:   microcytosis   CBC with Differential (Cancer Center Only)    Standing Status:   Future    Expected Date:   06/17/2024    Expiration Date:   09/15/2024   Iron and TIBC    Standing Status:   Future    Expected Date:   06/17/2024    Expiration Date:   09/15/2024   Ferritin    Standing Status:   Future    Expected Date:   06/17/2024    Expiration Date:   09/15/2024   Ambulatory referral to Social Work    Referral Priority:   Routine    Referral Type:   Consultation    Referral Reason:   Specialty Services Required    Number of Visits Requested:   1    All questions were answered. The patient knows to call the clinic with any problems, questions or concerns.  Zelphia Cap, MD, PhD [facility] Hematology Oncology 03/19/2024   HISTORY OF PRESENTING ILLNESS:   Laurie Petersen is a  33 y.o.  female with PMH listed below was seen in consultation at the request of  Franchot Isaiah LABOR, MD  for evaluation of iron deficiency anemia  Discussed the use of AI scribe software for clinical note transcription with the patient, who gave verbal consent to proceed.   She has ongoing heavy and irregular menstrual bleeding, with episodes lasting up to seven days and variable flow. Tubal ligation was performed previously without improvement in bleeding. She was evaluated by gynecology in October 2025 and prescribed oral contraceptives, but has not yet initiated therapy.  Laboratory studies from February 11, 2024, revealed hemoglobin 7.6 g/dL, iron saturation 5%, and ferritin 11 ng/mL. She has not taken iron supplementation for several years and has not used oral iron since her most recent laboratory evaluation.  She denies symptoms attributable to anemia, including fatigue, dyspnea, or cold intolerance. No gastrointestinal blood loss is reported.  She has a stable, painless, subcutaneous left cheek mass present for five to six years, described as movable and  cosmetically bothersome. No pain or growth is reported. She has been referred to dermatology for evaluation and possible excision, but has not yet completed the appointment.  MEDICAL HISTORY:  Past Medical History:  Diagnosis Date   Allergy    Anemia    Arthritis    Hypertension     SURGICAL HISTORY: Past Surgical History:  Procedure Laterality Date   CESAREAN SECTION     CESAREAN SECTION WITH BILATERAL TUBAL LIGATION Bilateral 04/04/2017   Procedure: REPEAT CESAREAN SECTION WITH BILATERAL TUBAL LIGATION;  Surgeon: Janit Alm Agent, MD;  Location: [facility] ORS;  Service: Obstetrics;  Laterality: Bilateral;   TONSILLECTOMY      SOCIAL HISTORY: Social History   Socioeconomic History   Marital status: Single    Spouse name: Research Officer, Political Party   Number of children: 3   Years of education: Not on file   Highest education level: Not on file  Occupational History   Not on file  Tobacco Use   Smoking status: Former    Current packs/day: 0.50    Average packs/day: 0.5 packs/day for 10.0 years (5.0 ttl pk-yrs)    Types: Cigarettes   Smokeless tobacco: Never  Vaping Use   Vaping status: Never Used  Substance and Sexual Activity   Alcohol use: Yes    Alcohol/week: 3.0 standard drinks of alcohol    Types: 3 Shots of liquor per week    Comment: last use 06/11/21 2x/mo   Drug use: Yes    Types: Marijuana    Comment: daily   Sexual activity: Yes    Partners: Male    Birth control/protection: Surgical  Other Topics Concern   Not on file  Social History Narrative   Not on file   Social Drivers of Health   Tobacco Use: Medium Risk (03/19/2024)   Patient History    Smoking Tobacco Use: Former    Smokeless Tobacco Use: Never    Passive Exposure: Not on file  Financial Resource Strain: Patient Declined (03/19/2024)   Overall Financial Resource Strain (CARDIA)    Difficulty of Paying Living Expenses: Patient declined  Food Insecurity: Food Insecurity Present (03/19/2024)   Epic     Worried About Programme Researcher, Broadcasting/film/video in the Last Year: Sometimes true    Ran Out of Food in the Last Year: Sometimes true  Transportation Needs: Unmet Transportation Needs (03/19/2024)   Epic    Lack of Transportation (Medical): Yes    Lack of Transportation (Non-Medical): Yes  Physical Activity: Not on file  Stress: Stress Concern Present (03/19/2024)   [employer] of Occupational Health - Occupational Stress Questionnaire  Feeling of Stress: To some extent  Social Connections: Unknown (08/23/2021)   Received from [facility]   Social Network    Social Network: Not on file  Intimate Partner Violence: Not At Risk (03/19/2024)   Epic    Fear of Current or Ex-Partner: No    Emotionally Abused: No    Physically Abused: No    Sexually Abused: No  Depression (PHQ2-9): Low Risk (03/19/2024)   Depression (PHQ2-9)    PHQ-2 Score: 4  Recent Concern: Depression (PHQ2-9) - Medium Risk (02/11/2024)   Depression (PHQ2-9)    PHQ-2 Score: 9  Alcohol Screen: Not on file  Housing: High Risk (03/19/2024)   Epic    Unable to Pay for Housing in the Last Year: Patient declined    Number of Times Moved in the Last Year: 2    Homeless in the Last Year: Yes  Utilities: Not At Risk (03/19/2024)   Epic    Threatened with loss of utilities: No  Health Literacy: Adequate Health Literacy (03/19/2024)   B1300 Health Literacy    Frequency of need for help with medical instructions: Never    FAMILY HISTORY: Family History  Problem Relation Age of Onset   Hypertension Mother    Arthritis Maternal Grandmother    Diabetes Maternal Grandmother     ALLERGIES:  has no known allergies.  MEDICATIONS:  Current Outpatient Medications  Medication Sig Dispense Refill   norethindrone  (MICRONOR ) 0.35 MG tablet TAKE 1 TABLET BY MOUTH EVERY DAY 84 tablet 3   No current facility-administered medications for this visit.    Review of Systems  Constitutional:  Negative for appetite change, chills,  fatigue and fever.  HENT:   Negative for hearing loss and voice change.   Eyes:  Negative for eye problems.  Respiratory:  Negative for chest tightness and cough.   Cardiovascular:  Negative for chest pain.  Gastrointestinal:  Negative for abdominal distention, abdominal pain and blood in stool.  Endocrine: Negative for hot flashes.  Genitourinary:  Positive for menstrual problem. Negative for difficulty urinating and frequency.   Musculoskeletal:  Negative for arthralgias.  Skin:  Negative for itching and rash.  Neurological:  Negative for extremity weakness.  Hematological:  Negative for adenopathy.  Psychiatric/Behavioral:  Negative for confusion.    PHYSICAL EXAMINATION:  Vitals:   03/19/24 0936  BP: 102/72  Pulse: 90  Resp: 18  Temp: 97.7 F (36.5 C)  SpO2: 100%   Filed Weights   03/19/24 0936  Weight: 158 lb 9.6 oz (71.9 kg)    Physical Exam Constitutional:      General: She is not in acute distress. HENT:     Head: Normocephalic and atraumatic.  Eyes:     General: No scleral icterus. Cardiovascular:     Rate and Rhythm: Normal rate and regular rhythm.  Pulmonary:     Effort: Pulmonary effort is normal. No respiratory distress.     Breath sounds: Normal breath sounds. No wheezing.  Abdominal:     General: Bowel sounds are normal. There is no distension.     Palpations: Abdomen is soft.  Musculoskeletal:        General: No deformity. Normal range of motion.     Cervical back: Normal range of motion and neck supple.  Skin:    General: Skin is warm and dry.     Findings: No erythema or rash.  Neurological:     Mental Status: She is alert and oriented to person, place, and time. Mental  status is at baseline.  Psychiatric:        Mood and Affect: Mood normal.     LABORATORY DATA:  I have reviewed the data as listed    Latest Ref Rng & Units 03/19/2024   10:12 AM 02/11/2024    2:39 PM 04/04/2017    5:30 AM  CBC  WBC 4.0 - 10.5 K/uL 4.8  6.0  8.0    Hemoglobin 12.0 - 15.0 g/dL 7.8  7.6  9.5   Hematocrit 36.0 - 46.0 % 27.6  28.2  29.1   Platelets 150 - 400 K/uL 222  310  147       Latest Ref Rng & Units 09/20/2016    4:41 PM 02/10/2016    5:18 AM 02/09/2016    6:12 AM  CMP  Glucose 65 - 99 mg/dL 895  891  82   BUN 6 - 20 mg/dL 11  10  8    Creatinine 0.44 - 1.00 mg/dL 9.45  9.24  9.26   Sodium 135 - 145 mmol/L 134  141  143   Potassium 3.5 - 5.1 mmol/L 4.1  3.8  3.2   Chloride 101 - 111 mmol/L 102  106  106   CO2 22 - 32 mmol/L 26  28  27    Calcium 8.9 - 10.3 mg/dL 9.3  8.7  8.4       RADIOGRAPHIC STUDIES: I have personally reviewed the radiological images as listed and agreed with the findings in the report. No results found.

## 2024-03-19 NOTE — Assessment & Plan Note (Signed)
 Labs are reviewed and discussed with patient. Lab Results  Component Value Date   HGB 7.8 (L) 03/19/2024   TIBC 468 (H) 03/19/2024   IRONPCTSAT 4 (L) 03/19/2024   FERRITIN 7 (L) 03/19/2024    I discussed about option IV Venofer treatments. I discussed about the potential risks including but not limited to allergic reactions/infusion reactions including anaphylactic reactions, diarrhea, phlebitis, high blood pressure, wheezing, SOB, skin rash, weight gain,dark urine, leg swelling, back pain, headache, nausea and fatigue, etc. Patient agrees with the plan.  recommend IV venofer weekly x 5

## 2024-03-19 NOTE — Assessment & Plan Note (Signed)
 Chronic microcytosis. Check hemoglobinopathy and alpha thalassemia analysis

## 2024-03-20 ENCOUNTER — Telehealth: Payer: Self-pay

## 2024-03-20 NOTE — Telephone Encounter (Signed)
 CSW Intern contacted patient by phone per referral from Dr. Babara regarding SDOH concerns. Patient asked for a call back later in the afternoon. Intern will call back at 2pm 12/18.  Thersia Daring Clinical Social Work Intern  [facility]

## 2024-03-20 NOTE — Telephone Encounter (Signed)
 CSW Intern tried to contact patient by phone, as requested by patient earlier in the day, to follow up on SDOH concerns per referral from Dr. Babara. CSW Intern left VM with direct contact information and encouraged patient to call back any time.  Thersia Daring Clinical Social Work Intern [facility]

## 2024-03-23 LAB — HGB FRACTIONATION CASCADE
Hgb A2: 1.8 % (ref 1.8–3.2)
Hgb A: 98.2 % (ref 96.4–98.8)
Hgb F: 0 % (ref 0.0–2.0)
Hgb S: 0 %

## 2024-03-25 ENCOUNTER — Inpatient Hospital Stay

## 2024-03-28 LAB — ALPHA-THALASSEMIA ANALYSIS: IMAGE: 0

## 2024-04-02 ENCOUNTER — Inpatient Hospital Stay

## 2024-04-02 VITALS — BP 100/65 | HR 70 | Temp 97.8°F | Resp 16

## 2024-04-02 DIAGNOSIS — D509 Iron deficiency anemia, unspecified: Secondary | ICD-10-CM | POA: Diagnosis not present

## 2024-04-02 DIAGNOSIS — D500 Iron deficiency anemia secondary to blood loss (chronic): Secondary | ICD-10-CM

## 2024-04-02 MED ORDER — IRON SUCROSE 20 MG/ML IV SOLN
200.0000 mg | Freq: Once | INTRAVENOUS | Status: AC
  Administered 2024-04-02: 200 mg via INTRAVENOUS

## 2024-04-02 MED ORDER — SODIUM CHLORIDE 0.9% FLUSH
10.0000 mL | Freq: Once | INTRAVENOUS | Status: AC | PRN
  Administered 2024-04-02: 10 mL
  Filled 2024-04-02: qty 10

## 2024-04-07 ENCOUNTER — Ambulatory Visit (INDEPENDENT_AMBULATORY_CARE_PROVIDER_SITE_OTHER)

## 2024-04-07 DIAGNOSIS — L720 Epidermal cyst: Secondary | ICD-10-CM

## 2024-04-07 NOTE — Progress Notes (Signed)
" °  °  Subjective   Laurie Petersen is a 34 y.o. female who presents for the following: Lesion(s) of concern . Patient is new patient  Today patient reports: LOC of the left jaw line x5 years, not painful and has never drained.   Review of Systems:    No other skin or systemic complaints except as noted in HPI or Assessment and Plan.  The following portions of the chart were reviewed this encounter and updated as appropriate: medications, allergies, medical history  Relevant Medical History:  n/a   Objective  (SKPE) Well appearing patient in no apparent distress; mood and affect are within normal limits. Examination was performed of the: Focused Exam of: Left mandible   Examination notable for: left mandible w/2cm mobile cyst       Assessment & Plan  (SKAP)   Subcutaneous cyst of the left mandible  - Explained to patient this most likely is consistent with an epidermal inclusion cyst, which represents trapped hair follicule and skin cells under the skin - Benign, patient reassured - Given that the lesion is symptomatic, patient would like to proceed with excision. Given the location of the lesion will refer to plastic surgery for excision; referral placed to [facility] plastics  - discussed excision in detail     Was sun protection counseling provided?: No   Level of service outlined above   Patient instructions (SKPI)   Procedures, orders, diagnosis for this visit:  EPIDERMAL INCLUSION CYST   This Visit - Ambulatory referral to Plastic Surgery  Epidermal inclusion cyst -     Ambulatory referral to Plastic Surgery    Return to clinic: Return if symptoms worsen or fail to improve.  I, Emerick Ege, CMA am acting as scribe for Lauraine JAYSON Kanaris, MD.   Documentation: I have reviewed the above documentation for accuracy and completeness, and I agree with the above.  Lauraine JAYSON Kanaris, MD  "

## 2024-04-07 NOTE — Patient Instructions (Signed)

## 2024-04-08 ENCOUNTER — Inpatient Hospital Stay

## 2024-04-10 ENCOUNTER — Inpatient Hospital Stay: Attending: Oncology

## 2024-04-10 DIAGNOSIS — D509 Iron deficiency anemia, unspecified: Secondary | ICD-10-CM | POA: Insufficient documentation

## 2024-04-10 DIAGNOSIS — N920 Excessive and frequent menstruation with regular cycle: Secondary | ICD-10-CM | POA: Insufficient documentation

## 2024-04-14 ENCOUNTER — Inpatient Hospital Stay

## 2024-04-14 VITALS — BP 110/84 | HR 63 | Temp 96.8°F | Resp 16

## 2024-04-14 DIAGNOSIS — D500 Iron deficiency anemia secondary to blood loss (chronic): Secondary | ICD-10-CM

## 2024-04-14 DIAGNOSIS — N920 Excessive and frequent menstruation with regular cycle: Secondary | ICD-10-CM | POA: Diagnosis not present

## 2024-04-14 DIAGNOSIS — D509 Iron deficiency anemia, unspecified: Secondary | ICD-10-CM | POA: Diagnosis present

## 2024-04-14 MED ORDER — IRON SUCROSE 20 MG/ML IV SOLN
200.0000 mg | Freq: Once | INTRAVENOUS | Status: AC
  Administered 2024-04-14: 200 mg via INTRAVENOUS
  Filled 2024-04-14: qty 10

## 2024-04-14 NOTE — Telephone Encounter (Signed)
 Needs appointment

## 2024-04-14 NOTE — Patient Instructions (Signed)

## 2024-04-30 ENCOUNTER — Institutional Professional Consult (permissible substitution): Admitting: Plastic Surgery

## 2024-05-14 ENCOUNTER — Institutional Professional Consult (permissible substitution): Admitting: Plastic Surgery

## 2024-06-16 ENCOUNTER — Inpatient Hospital Stay

## 2024-06-20 ENCOUNTER — Inpatient Hospital Stay: Admitting: Oncology

## 2024-06-20 ENCOUNTER — Inpatient Hospital Stay

## 2025-02-12 ENCOUNTER — Encounter
